# Patient Record
Sex: Female | Born: 1980
Health system: Southern US, Community
[De-identification: ages and names within clinical notes are randomized; demographics above are authoritative.]

## PROBLEM LIST (undated history)

## (undated) DIAGNOSIS — Z789 Other specified health status: Secondary | ICD-10-CM

## (undated) DIAGNOSIS — Z6833 Body mass index (BMI) 33.0-33.9, adult: Secondary | ICD-10-CM

## (undated) DIAGNOSIS — I10 Essential (primary) hypertension: Secondary | ICD-10-CM

## (undated) HISTORY — DX: Body mass index (BMI) 33.0-33.9, adult: Z68.33

## (undated) HISTORY — DX: Essential (primary) hypertension: I10

## (undated) HISTORY — PX: NO PAST SURGERIES: SHX2092

---

## 2003-09-15 ENCOUNTER — Emergency Department (HOSPITAL_COMMUNITY): Admission: EM | Admit: 2003-09-15 | Discharge: 2003-09-16 | Payer: Self-pay | Admitting: Emergency Medicine

## 2008-01-30 ENCOUNTER — Emergency Department (HOSPITAL_COMMUNITY): Admission: EM | Admit: 2008-01-30 | Discharge: 2008-01-30 | Payer: Self-pay | Admitting: Emergency Medicine

## 2010-10-21 ENCOUNTER — Inpatient Hospital Stay (HOSPITAL_COMMUNITY): Admission: AD | Admit: 2010-10-21 | Payer: Self-pay | Admitting: Obstetrics

## 2010-11-16 ENCOUNTER — Inpatient Hospital Stay (HOSPITAL_COMMUNITY)
Admission: AD | Admit: 2010-11-16 | Discharge: 2010-11-16 | Disposition: A | Discharge status: Home / Self Care | Payer: Self-pay | Source: Ambulatory Visit | Attending: Obstetrics | Admitting: Obstetrics

## 2010-11-16 DIAGNOSIS — O99891 Other specified diseases and conditions complicating pregnancy: Secondary | ICD-10-CM | POA: Insufficient documentation

## 2010-11-16 DIAGNOSIS — O9989 Other specified diseases and conditions complicating pregnancy, childbirth and the puerperium: Secondary | ICD-10-CM

## 2010-11-16 DIAGNOSIS — R109 Unspecified abdominal pain: Secondary | ICD-10-CM | POA: Insufficient documentation

## 2010-11-16 DIAGNOSIS — R197 Diarrhea, unspecified: Secondary | ICD-10-CM | POA: Insufficient documentation

## 2010-11-26 ENCOUNTER — Inpatient Hospital Stay (HOSPITAL_COMMUNITY)
Admission: AD | Admit: 2010-11-26 | Discharge: 2010-11-29 | DRG: 774 | Disposition: A | Discharge status: Home / Self Care | Payer: Medicaid Other | Source: Ambulatory Visit | Attending: Obstetrics | Admitting: Obstetrics

## 2010-11-26 LAB — CBC
HCT: 36.2 % (ref 36.0–46.0)
MCHC: 34 g/dL (ref 30.0–36.0)
MCV: 86 fL (ref 78.0–100.0)
Platelets: 436 10*3/uL — ABNORMAL HIGH (ref 150–400)
RDW: 13.5 % (ref 11.5–15.5)

## 2010-11-28 LAB — CBC
HCT: 32 % — ABNORMAL LOW (ref 36.0–46.0)
MCH: 28.6 pg (ref 26.0–34.0)
MCHC: 33.1 g/dL (ref 30.0–36.0)
MCV: 86.5 fL (ref 78.0–100.0)
RDW: 13.6 % (ref 11.5–15.5)

## 2011-05-21 LAB — BASIC METABOLIC PANEL
BUN: 11
Calcium: 9.3
Creatinine, Ser: 0.68
GFR calc non Af Amer: >60
Glucose, Bld: 78

## 2011-05-21 LAB — CBC
HCT: 41
Platelets: 303
RDW: 12.2

## 2011-05-21 LAB — POCT CARDIAC MARKERS
Myoglobin, poc: 46.9
Operator id: 272551

## 2011-05-21 LAB — D-DIMER, QUANTITATIVE: D-Dimer, Quant: <0.22

## 2011-08-10 ENCOUNTER — Emergency Department (HOSPITAL_BASED_OUTPATIENT_CLINIC_OR_DEPARTMENT_OTHER)
Admission: EM | Admit: 2011-08-10 | Discharge: 2011-08-10 | Disposition: A | Discharge status: Home / Self Care | Payer: Self-pay | Attending: Emergency Medicine | Admitting: Emergency Medicine

## 2011-08-10 ENCOUNTER — Emergency Department (INDEPENDENT_AMBULATORY_CARE_PROVIDER_SITE_OTHER): Payer: Self-pay

## 2011-08-10 ENCOUNTER — Other Ambulatory Visit: Payer: Self-pay

## 2011-08-10 ENCOUNTER — Encounter: Payer: Self-pay | Admitting: *Deleted

## 2011-08-10 DIAGNOSIS — M79609 Pain in unspecified limb: Secondary | ICD-10-CM

## 2011-08-10 DIAGNOSIS — M542 Cervicalgia: Secondary | ICD-10-CM | POA: Insufficient documentation

## 2011-08-10 DIAGNOSIS — R0789 Other chest pain: Secondary | ICD-10-CM

## 2011-08-10 DIAGNOSIS — N644 Mastodynia: Secondary | ICD-10-CM | POA: Insufficient documentation

## 2011-08-10 DIAGNOSIS — R0602 Shortness of breath: Secondary | ICD-10-CM | POA: Insufficient documentation

## 2011-08-10 LAB — BASIC METABOLIC PANEL
BUN: 11 mg/dL (ref 6–23)
CO2: 24 mEq/L (ref 19–32)
Calcium: 9.4 mg/dL (ref 8.4–10.5)
Chloride: 106 mEq/L (ref 96–112)
Creatinine, Ser: 0.6 mg/dL (ref 0.50–1.10)
GFR calc Af Amer: >90 mL/min (ref >90)
GFR calc non Af Amer: >90 mL/min (ref >90)
Glucose, Bld: 92 mg/dL (ref 70–99)
Potassium: 3.7 mEq/L (ref 3.5–5.1)
Sodium: 140 mEq/L (ref 135–145)

## 2011-08-10 LAB — CBC
HCT: 41.1 % (ref 36.0–46.0)
Hemoglobin: 14.1 g/dL (ref 12.0–15.0)
MCH: 28.4 pg (ref 26.0–34.0)
MCHC: 34.3 g/dL (ref 30.0–36.0)
MCV: 82.9 fL (ref 78.0–100.0)
Platelets: 336 10*3/uL (ref 150–400)
RBC: 4.96 MIL/uL (ref 3.87–5.11)
RDW: 12.9 % (ref 11.5–15.5)
WBC: 10.7 10*3/uL — ABNORMAL HIGH (ref 4.0–10.5)

## 2011-08-10 LAB — URINALYSIS, ROUTINE W REFLEX MICROSCOPIC
Glucose, UA: NEGATIVE mg/dL
Hgb urine dipstick: NEGATIVE
Ketones, ur: NEGATIVE mg/dL
Protein, ur: NEGATIVE mg/dL
Urobilinogen, UA: 1 mg/dL (ref 0.0–1.0)

## 2011-08-10 LAB — URINE MICROSCOPIC-ADD ON: RBC / HPF: NONE SEEN RBC/hpf (ref <3)

## 2011-08-10 LAB — PREGNANCY, URINE: Preg Test, Ur: NEGATIVE

## 2011-08-10 MED ORDER — ACETAMINOPHEN 500 MG PO TABS
1000.0000 mg | ORAL_TABLET | Freq: Once | ORAL | Status: AC
  Administered 2011-08-10: 1000 mg via ORAL
  Filled 2011-08-10: qty 2

## 2011-08-10 NOTE — ED Provider Notes (Signed)
History  This chart was scribed for Gerhard Munch, MD by Bennett Scrape. This patient was seen in room MH10/MH10 and the patient's care was started at 5:00PM.  CSN: [id number] Arrival date & time: 08/10/2011  4:41 PM   First MD Initiated Contact with Patient 08/10/11 1653      Chief Complaint  Patient presents with  . Chest Pain     The history is provided by the patient. No language interpreter was used.    Ariel Barnes is a 30 y.o. female who presents to the Emergency Department complaining of 2 weeks of gradual onset, gradually worsening, constant pain in her left breast with occasional radiating pain described as pulsating to her left shoulder, left upper back, left arm and left neck. Pt states that she has experienced this pain for 2 months but that it has returned over the last 2 weeks and has become gradually worse. Pt also states that she feels a pulsating pain with deep breaths that is not exertional and occasionally feels pulsations in the right breast as well. She reports that she saw her PCP and was told that it was a muscle pain.  She also reports that IB profen does not improve her symptoms. She states that she experienced a similar but milder pain when nursing. Pt also c/o a mild cough and occasional mild SOB that is not exertional. Pt denies confusion, fever, sore throat, chest pain, abdominal pain, nausea, diarrhea, vomiting, cranial issues and leg swelling as associated symptoms. She denies having a h/o any chronic medical conditions and being on any prescription medications at home. She denies any recent recent travel or injury as the cause of the pain. Pt denies smoking and alcohol use.    History reviewed. No pertinent past medical history.  History reviewed. No pertinent past surgical history.  History reviewed. No pertinent family history.  History  Substance Use Topics  . Smoking status: Never Smoker   . Smokeless tobacco: Not on file  . Alcohol Use:      OB History    Grav Para Term Preterm Abortions TAB SAB Ect Mult Living                  Review of Systems  Constitutional: Negative for fever.  HENT: Positive for neck pain (left sided). Negative for congestion and sore throat.   Respiratory: Positive for cough and shortness of breath (ocassionally).   Cardiovascular: Negative for leg swelling.  Gastrointestinal: Negative for nausea, vomiting, abdominal pain and diarrhea.  Musculoskeletal: Positive for back pain (left sided).       Constant left breast pain  Neurological: Negative for syncope, weakness, light-headedness and headaches.  Psychiatric/Behavioral: Negative for confusion.  All other systems reviewed and are negative.    Allergies  Review of patient's allergies indicates no known allergies.  Home Medications   Current Outpatient Rx  Name Route Sig Dispense Refill  . IBUPROFEN 200 MG PO TABS Oral Take 400 mg by mouth every 6 (six) hours as needed. For pain      . LEVONORGESTREL 20 MCG/24HR IU IUD Intrauterine 1 each by Intrauterine route once.        Triage Vitals: BP 125/84  Pulse 55  Temp(Src) 98.3 F (36.8 C) (Oral)  Resp 16  Ht 5\' 4"  (1.626 m)  Wt 180 lb (81.647 kg)  BMI 30.90 kg/m2  SpO2 100%  Physical Exam  Nursing note and vitals reviewed. Constitutional: She is oriented to person, place, and time. She appears well-developed  and well-nourished.  HENT:  Head: Normocephalic and atraumatic.       No pharyngitis   Eyes: Conjunctivae and EOM are normal.  Neck: Normal range of motion. Neck supple.  Cardiovascular: Normal rate, regular rhythm and normal heart sounds.   Pulmonary/Chest: Effort normal and breath sounds normal. No respiratory distress. She exhibits no tenderness.  Abdominal: Soft. Bowel sounds are normal. There is no tenderness.       No CVA tenderness  Musculoskeletal: Normal range of motion. She exhibits tenderness (fibrocystic left breast changes, minimally tender, no palpable  cyst, no superficial changes). She exhibits no edema.  Neurological: She is alert and oriented to person, place, and time. No cranial nerve deficit.  Skin: Skin is warm and dry.  Psychiatric: She has a normal mood and affect. Her behavior is normal.    ED Course  Procedures (including critical care time)  DIAGNOSTIC STUDIES: Oxygen Saturation is 100% on room air, normal by my interpretation.    COORDINATION OF CARE: 5:10PM-Discussed treatment plan with pt at bedside and pt agreed to plan.   Labs Reviewed  URINALYSIS, ROUTINE W REFLEX MICROSCOPIC - Abnormal; Notable for the following:    Leukocytes, UA TRACE (*)    All other components within normal limits  CBC - Abnormal; Notable for the following:    WBC 10.7 (*)    All other components within normal limits  URINE MICROSCOPIC-ADD ON - Abnormal; Notable for the following:    Squamous Epithelial / LPF FEW (*)    Bacteria, UA FEW (*)    All other components within normal limits  PREGNANCY, URINE  BASIC METABOLIC PANEL   Dg Chest 2 View  08/10/2011  *RADIOLOGY REPORT*  Clinical Data: History of pain in the left side of the chest and down the left arm.  CHEST - 2 VIEW  Comparison: None.  Findings: The cardiac silhouette is normal size and shape.  No mediastinal or hilar abnormalities are evident. No pleural abnormality is evident. The lungs are well aerated and free of infiltrates.  No pneumothorax is evident. Bones appear average for age.  IMPRESSION: No acute or active cardiopulmonary or pleural abnormality is evident.  Original Report Authenticated By: Crawford Givens, M.D.  (REVIEWED BY ME)   No diagnosis found.   Date: 08/10/2011  Rate: 66  Rhythm: normal sinus rhythm and sinus arrhythmia  QRS Axis: normal  Intervals: normal  ST/T Wave abnormalities: normal  Conduction Disutrbances:none  Narrative Interpretation:   Old EKG Reviewed: none available ABNORMAL ECG   MDM  I personally performed the services described in this  documentation, which was scribed in my presence. The recorded information has been reviewed and considered.  This well-appearing 30 year old female now presents with 2 weeks of left sided breast pain the patient has no risk factors for CAD, nor any for PE. The patient also describes a history of similar pain throughout her lactating. This history is suggestive of a fibrotic changes of the breast. The patient's laboratory evaluation is unremarkable. She denies any urologic complaints and thus will not be treated for UTI the patient's ECG and chest x-ray are similarly reassuring. The patient will be advised on analgesics, and be discharged to follow up at [facility] for an ultrasound and continued evaluation  Gerhard Munch, MD 08/10/11 [phone]

## 2011-08-10 NOTE — ED Notes (Signed)
Pt c/o left sided chest pain  X 2 weeks, seen by PMD for same

## 2013-01-29 ENCOUNTER — Emergency Department (HOSPITAL_BASED_OUTPATIENT_CLINIC_OR_DEPARTMENT_OTHER)
Admission: EM | Admit: 2013-01-29 | Discharge: 2013-01-29 | Disposition: A | Discharge status: Home / Self Care | Payer: No Typology Code available for payment source | Attending: Emergency Medicine | Admitting: Emergency Medicine

## 2013-01-29 ENCOUNTER — Encounter (HOSPITAL_BASED_OUTPATIENT_CLINIC_OR_DEPARTMENT_OTHER): Payer: Self-pay

## 2013-01-29 DIAGNOSIS — Y9389 Activity, other specified: Secondary | ICD-10-CM | POA: Insufficient documentation

## 2013-01-29 DIAGNOSIS — IMO0002 Reserved for concepts with insufficient information to code with codable children: Secondary | ICD-10-CM | POA: Insufficient documentation

## 2013-01-29 DIAGNOSIS — S46909A Unspecified injury of unspecified muscle, fascia and tendon at shoulder and upper arm level, unspecified arm, initial encounter: Secondary | ICD-10-CM | POA: Insufficient documentation

## 2013-01-29 DIAGNOSIS — Y92410 Unspecified street and highway as the place of occurrence of the external cause: Secondary | ICD-10-CM | POA: Insufficient documentation

## 2013-01-29 DIAGNOSIS — S4980XA Other specified injuries of shoulder and upper arm, unspecified arm, initial encounter: Secondary | ICD-10-CM | POA: Insufficient documentation

## 2013-01-29 DIAGNOSIS — S139XXA Sprain of joints and ligaments of unspecified parts of neck, initial encounter: Secondary | ICD-10-CM | POA: Insufficient documentation

## 2013-01-29 DIAGNOSIS — S161XXA Strain of muscle, fascia and tendon at neck level, initial encounter: Secondary | ICD-10-CM

## 2013-01-29 MED ORDER — CYCLOBENZAPRINE HCL 10 MG PO TABS: 10.0000 mg | ORAL_TABLET | Freq: Two times a day (BID) | ORAL | Status: DC | PRN

## 2013-01-29 MED ORDER — TRAMADOL HCL 50 MG PO TABS: 50.0000 mg | ORAL_TABLET | Freq: Four times a day (QID) | ORAL | Status: DC | PRN
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
## 2013-11-30 ENCOUNTER — Other Ambulatory Visit: Payer: Self-pay | Admitting: Nurse Practitioner

## 2013-11-30 DIAGNOSIS — N644 Mastodynia: Secondary | ICD-10-CM

## 2013-12-11 ENCOUNTER — Ambulatory Visit
Admission: RE | Admit: 2013-12-11 | Discharge: 2013-12-11 | Disposition: A | Discharge status: Home / Self Care | Payer: No Typology Code available for payment source | Source: Ambulatory Visit | Attending: Nurse Practitioner | Admitting: Nurse Practitioner

## 2013-12-11 DIAGNOSIS — N644 Mastodynia: Secondary | ICD-10-CM

## 2014-03-13 LAB — CYTOLOGY - PAP: PAP SMEAR: NEGATIVE

## 2014-08-24 NOTE — L&D Delivery Note (Signed)
Delivery Note At 3:32 PM a viable female was delivered via Vaginal, Spontaneous Delivery (Presentation: Right Occiput Anterior).  APGAR: , ; weight  .   Placenta status: , .  Cord:  with the following complications: .   :  Anesthesia:   Episiotomy:   Lacerations:   Suture Repair: 2.0 Est. Blood Loss (mL):    Mom to postpartum.  Baby to Couplet care / Skin to Skin.  [name] A 10/01/2014, 3:42 PM

## 2014-09-21 ENCOUNTER — Encounter (HOSPITAL_COMMUNITY): Payer: Self-pay | Admitting: *Deleted

## 2014-09-21 ENCOUNTER — Inpatient Hospital Stay (HOSPITAL_COMMUNITY)
Admission: AD | Admit: 2014-09-21 | Discharge: 2014-09-21 | Disposition: A | Discharge status: Home / Self Care | Payer: No Typology Code available for payment source | Source: Ambulatory Visit | Attending: Obstetrics | Admitting: Obstetrics

## 2014-09-21 DIAGNOSIS — O368130 Decreased fetal movements, third trimester, not applicable or unspecified: Secondary | ICD-10-CM | POA: Insufficient documentation

## 2014-09-21 DIAGNOSIS — Z3A37 37 weeks gestation of pregnancy: Secondary | ICD-10-CM | POA: Insufficient documentation

## 2014-09-21 DIAGNOSIS — Z3490 Encounter for supervision of normal pregnancy, unspecified, unspecified trimester: Secondary | ICD-10-CM

## 2014-09-21 HISTORY — DX: Other specified health status: Z78.9

## 2014-09-21 NOTE — MAU Note (Signed)
Decreased fetal movement today,  Mucous d/c yesterday, no bleeding.  Contracting yesterday 5-10, stopped last night.  Only a few contractions today.  Mainly worried about movement

## 2014-09-21 NOTE — Discharge Instructions (Signed)
Reasons to return to MAU:  1.  Contractions are  5 minutes apart or less, each last 1 minute, these have been going on for 1-2 hours, and you cannot walk or talk during them 2.  You have a large gush of fluid, or a trickle of fluid that will not stop and you have to wear a pad 3.  You have bleeding that is bright red, heavier than spotting--like menstrual bleeding (spotting can be normal in early labor or after a check of your cervix) 4.  You do not feel the baby moving like he/she normally does.  Evaluacin de los movimientos fetales  (Fetal Movement Counts) Nombre del paciente: __________________________________________________ Ariel Barnes estimada: ____________________ Ariel Barnes de los movimientos fetales es muy recomendable en los embarazos de alto riesgo, pero tambin es una buena idea que lo hagan todas las Ariel Barnes. El Ariel Barnes, retail que comience a contarlos a las 28 semanas de [place]. Los movimientos fetales suelen aumentar:   Despus de Music therapist.  Despus de la actividad fsica.  Despus de comer o beber Ariel Barnes o fro.  En reposo. Preste atencin cuando sienta que el beb est ms activo. Esto le ayudar a notar un patrn de ciclos de vigilia y sueo de su beb y cules son los factores que contribuyen a un aumento de los movimientos fetales. Es importante llevar a cabo un recuento de movimientos fetales, al mismo tiempo cada da, cuando el beb normalmente est ms activo.  [place] 1. Busque un lugar tranquilo y cmodo para sentarse o recostarse sobre el lado izquierdo. Al recostarse sobre su lado izquierdo, le proporciona una mejor circulacin de [place] y oxgeno al beb. 2. Anote el da y la hora en una hoja de papel o en un diario. 3. Comience contando las pataditas, revoloteos, chasquidos, vueltas o pinchazos en un perodo de 2 horas. Debe sentir al menos 10 movimientos en 2 horas. 4. Si no siente 10 movimientos en 2  horas, espere 2  3 horas y cuente de nuevo. Busque cambios en el patrn o si no cuenta lo suficiente en 2 horas. SOLICITE ATENCIN MDICA SI:   Siente menos de 10 pataditas en 2 horas, en dos intentos.  No hay movimientos durante una hora.  El patrn se modifica o le lleva ms [place] contar las 10 pataditas.  Siente que el beb no se mueve como lo hace habitualmente. Fecha: ____________ Movimientos: ____________ Ariel Barnes inicio: ____________ Ariel Barnes finalizacin: ____________  Ariel Barnes: ____________ Movimientos: ____________ Ariel Barnes inicio: ____________ Ariel Barnes finalizacin: ____________  Ariel Barnes: ____________ Movimientos: ____________ Ariel Barnes inicio: ____________ Ariel Barnes finalizacin: ____________  Ariel Barnes: ____________ Movimientos: ____________ Ariel Barnes inicio: ____________ Ariel Barnes finalizacin: ____________  Ariel Barnes: ____________ Movimientos: ____________ Ariel Barnes inicio: ____________ Ariel Barnes de finalizacin: ____________  Ariel Barnes: ____________ Movimientos: ____________ Ariel Barnes de inicio: ____________ Ariel Barnes de finalizacin: ____________  Ariel Barnes: ____________ Movimientos: ____________ Ariel Barnes de inicio: ____________ Ariel Barnes de finalizacin: ____________  Ariel Barnes: ____________ Movimientos: ____________ Ariel Barnes de inicio: ____________ Ariel Barnes de finalizacin: ____________  Ariel Barnes: ____________ Movimientos: ____________ Ariel Barnes de inicio: ____________ Ariel Barnes de finalizacin: ____________  Ariel Barnes: ____________ Movimientos: ____________ Ariel Barnes de inicio: ____________ Ariel Barnes de finalizacin: ____________  Ariel Barnes: ____________ Movimientos: ____________ Ariel Barnes inicio: ____________ Ariel Barnes de finalizacin: ____________  Ariel Barnes: ____________ Movimientos: ____________ Ariel Barnes inicio: ____________ Ariel Barnes finalizacin: ____________  Ariel Barnes: ____________ Movimientos: ____________ Ariel Barnes inicio: ____________ Ariel Barnes finalizacin: ____________  Ariel Barnes: ____________ Movimientos: ____________ Ariel Barnes inicio: ____________ Ariel Barnes finalizacin:  ____________  Ariel Barnes: ____________ Movimientos: ____________ Ariel Barnes inicio: ____________ Ariel Barnes finalizacin: ____________  Ariel Barnes: ____________ Movimientos: ____________ Ariel Barnes inicio: ____________ Ariel Barnes finalizacin: ____________  Ariel Barnes: ____________ Movimientos: ____________ Ariel Barnes inicio: ____________ Ariel Barnes finalizacin: ____________  Ariel Barnes: ____________ Movimientos: ____________ Ariel Barnes inicio: ____________ Ariel Barnes finalizacin: ____________  Ariel Barnes: ____________ Movimientos: ____________ Ariel Barnes inicio: ____________ Ariel Barnes finalizacin: ____________  Ariel Barnes: ____________ Movimientos: ____________ Ariel Barnes inicio: ____________ Ariel Barnes de finalizacin: ____________  Ariel Barnes: ____________ Movimientos: ____________ Ariel Barnes de inicio: ____________ Ariel Barnes de finalizacin: ____________  Ariel Barnes: ____________ Movimientos: ____________ Ariel Barnes de inicio: ____________ Ariel Barnes de finalizacin: ____________  Ariel Barnes: ____________ Movimientos: ____________ Ariel Barnes de inicio: ____________ Ariel Barnes de finalizacin: ____________  Ariel Barnes: ____________ Movimientos: ____________ Ariel Barnes de inicio: ____________ Ariel Barnes de finalizacin: ____________  Ariel Barnes: ____________ Movimientos: ____________ Ariel Barnes de inicio: ____________ Ariel Barnes de finalizacin: ____________  Ariel Barnes: ____________ Movimientos: ____________ Ariel Barnes de inicio: ____________ Ariel Barnes de finalizacin: ____________  Ariel Barnes: ____________ Movimientos: ____________ Ariel Barnes de inicio: ____________ Ariel Barnes de finalizacin: ____________  Ariel Barnes: ____________ Movimientos: ____________ Ariel Barnes de inicio: ____________ Ariel Barnes de finalizacin: ____________  Ariel Barnes: ____________ Movimientos: ____________ Ariel Barnes de inicio: ____________ Ariel Barnes de finalizacin: ____________  Ariel Barnes: ____________ Movimientos: ____________ Ariel Barnes de inicio: ____________ Ariel Barnes de finalizacin: ____________  Ariel Barnes: ____________ Movimientos: ____________ Ariel Barnes de inicio: ____________ Ariel Barnes de finalizacin: ____________  Ariel Barnes: ____________  Movimientos: ____________ Ariel Barnes de inicio: ____________ Ariel Barnes de finalizacin: ____________  Ariel Barnes: ____________ Movimientos: ____________ Ariel Barnes de inicio: ____________ Ariel Barnes de finalizacin: ____________  Ariel Barnes: ____________ Movimientos: ____________ Ariel Barnes de inicio: ____________ Ariel Barnes de finalizacin: ____________  Ariel Barnes: ____________ Movimientos: ____________ Ariel Barnes de inicio: ____________ Ariel Barnes de finalizacin: ____________  Ariel Barnes: ____________ Movimientos: ____________ Ariel Barnes de inicio: ____________ Ariel Barnes de finalizacin: ____________  Ariel Barnes: ____________ Movimientos: ____________ Ariel Barnes de inicio: ____________ Ariel Barnes de finalizacin: ____________  Ariel Barnes: ____________ Movimientos: ____________ Ariel Barnes de inicio: ____________ Ariel Barnes de finalizacin: ____________  Ariel Barnes: ____________ Movimientos: ____________ Ariel Barnes de inicio: ____________ Ariel Barnes de finalizacin: ____________  Ariel Barnes: ____________ Movimientos: ____________ Ariel Barnes de inicio: ____________ Ariel Barnes de finalizacin: ____________  Ariel Barnes: ____________ Movimientos: ____________ Ariel Barnes de inicio: ____________ Ariel Barnes de finalizacin: ____________  Ariel Barnes: ____________ Movimientos: ____________ Ariel Barnes de inicio: ____________ Ariel Barnes de finalizacin: ____________  Ariel Barnes: ____________ Movimientos: ____________ Ariel Barnes de inicio: ____________ Ariel Barnes de finalizacin: ____________  Ariel Barnes: ____________ Movimientos: ____________ Ariel Barnes de inicio: ____________ Ariel Barnes de finalizacin: ____________  Ariel Barnes: ____________ Movimientos: ____________ Ariel Barnes de inicio: ____________ Ariel Barnes de finalizacin: ____________  Ariel Barnes: ____________ Movimientos: ____________ Ariel Barnes de inicio: ____________ Ariel Barnes de finalizacin: ____________  Ariel Barnes: ____________ Movimientos: ____________ Ariel Barnes de inicio: ____________ Ariel Barnes de finalizacin: ____________  Ariel Barnes: ____________ Movimientos: ____________ Ariel Barnes de inicio: ____________ Ariel Barnes de finalizacin: ____________  Ariel Barnes: ____________ Movimientos: ____________ Ariel Barnes de inicio:  ____________ Ariel Barnes de finalizacin: ____________  Ariel Barnes: ____________ Movimientos: ____________ Ariel Barnes de inicio: ____________ Ariel Barnes de finalizacin: ____________  Ariel Barnes: ____________ Movimientos: ____________ Ariel Barnes de inicio: ____________ Ariel Barnes de finalizacin: ____________  Ariel Barnes: ____________ Movimientos: ____________ Ariel Barnes de inicio: ____________ Ariel Barnes de finalizacin: ____________  Ariel Barnes: ____________ Movimientos: ____________ Ariel Barnes de inicio: ____________ Ariel Barnes de finalizacin: ____________  Ariel Barnes: ____________ Movimientos: ____________ Ariel Barnes de inicio: ____________ Ariel Barnes de finalizacin: ____________  Ariel Barnes: ____________ Movimientos: ____________ Ariel Barnes de inicio: ____________ Ariel Barnes de finalizacin: ____________  Ariel Barnes: ____________ Movimientos: ____________ Ariel Barnes de inicio: ____________ Ariel Barnes de finalizacin: ____________  Document Released: 11/17/2007 Document Revised: 07/27/2012 Exit[employer] Patient Information 2015 [place], [place]. This information is not intended to replace advice given to you by your health care provider. Make sure you discuss any questions you have with your health care provider.

## 2014-10-01 ENCOUNTER — Inpatient Hospital Stay (HOSPITAL_COMMUNITY): Payer: Medicaid Other | Admitting: Anesthesiology

## 2014-10-01 ENCOUNTER — Encounter (HOSPITAL_COMMUNITY): Payer: Self-pay | Admitting: *Deleted

## 2014-10-01 ENCOUNTER — Inpatient Hospital Stay (HOSPITAL_COMMUNITY)
Admission: AD | Admit: 2014-10-01 | Discharge: 2014-10-02 | DRG: 775 | Disposition: A | Discharge status: Home / Self Care | Payer: Medicaid Other | Source: Ambulatory Visit | Attending: Obstetrics | Admitting: Obstetrics

## 2014-10-01 DIAGNOSIS — IMO0001 Reserved for inherently not codable concepts without codable children: Secondary | ICD-10-CM

## 2014-10-01 DIAGNOSIS — Z3A39 39 weeks gestation of pregnancy: Secondary | ICD-10-CM | POA: Diagnosis present

## 2014-10-01 DIAGNOSIS — Z23 Encounter for immunization: Secondary | ICD-10-CM | POA: Diagnosis not present

## 2014-10-01 LAB — OB RESULTS CONSOLE HEPATITIS B SURFACE ANTIGEN: HEP B S AG: NEGATIVE

## 2014-10-01 LAB — CBC
HEMATOCRIT: 36.6 % (ref 36.0–46.0)
Hemoglobin: 12.4 g/dL (ref 12.0–15.0)
MCH: 28.8 pg (ref 26.0–34.0)
MCHC: 33.9 g/dL (ref 30.0–36.0)
MCV: 84.9 fL (ref 78.0–100.0)
PLATELETS: 411 10*3/uL — AB (ref 150–400)
RBC: 4.31 MIL/uL (ref 3.87–5.11)
RDW: 13.4 % (ref 11.5–15.5)
WBC: 19.8 10*3/uL — AB (ref 4.0–10.5)

## 2014-10-01 LAB — OB RESULTS CONSOLE ABO/RH: RH Type: POSITIVE

## 2014-10-01 LAB — OB RESULTS CONSOLE ANTIBODY SCREEN: ANTIBODY SCREEN: NEGATIVE

## 2014-10-01 LAB — POCT FERN TEST: POCT Fern Test: POSITIVE

## 2014-10-01 LAB — OB RESULTS CONSOLE GC/CHLAMYDIA
Chlamydia: NEGATIVE
Gonorrhea: NEGATIVE

## 2014-10-01 LAB — OB RESULTS CONSOLE GBS: STREP GROUP B AG: NEGATIVE

## 2014-10-01 LAB — OB RESULTS CONSOLE RUBELLA ANTIBODY, IGM: RUBELLA: IMMUNE

## 2014-10-01 LAB — OB RESULTS CONSOLE RPR: RPR: NONREACTIVE

## 2014-10-01 LAB — OB RESULTS CONSOLE HIV ANTIBODY (ROUTINE TESTING): HIV: NONREACTIVE

## 2014-10-01 MED ORDER — DIPHENHYDRAMINE HCL 25 MG PO CAPS: 25.0000 mg | ORAL_CAPSULE | Freq: Four times a day (QID) | ORAL | Status: DC | PRN

## 2014-10-01 MED ORDER — LIDOCAINE HCL (PF) 1 % IJ SOLN
INTRAMUSCULAR | Status: DC | PRN
  Administered 2014-10-01 (×2): 4 mL

## 2014-10-01 MED ORDER — BENZOCAINE-MENTHOL 20-0.5 % EX AERO
1.0000 "application " | INHALATION_SPRAY | CUTANEOUS | Status: DC | PRN
  Administered 2014-10-01 – 2014-10-02 (×2): 1 via TOPICAL
  Filled 2014-10-01 (×2): qty 56

## 2014-10-01 MED ORDER — OXYCODONE-ACETAMINOPHEN 5-325 MG PO TABS: 2.0000 | ORAL_TABLET | ORAL | Status: DC | PRN

## 2014-10-01 MED ORDER — SIMETHICONE 80 MG PO CHEW: 80.0000 mg | CHEWABLE_TABLET | ORAL | Status: DC | PRN

## 2014-10-01 MED ORDER — ACETAMINOPHEN 325 MG PO TABS: 650.0000 mg | ORAL_TABLET | ORAL | Status: DC | PRN

## 2014-10-01 MED ORDER — LACTATED RINGERS IV SOLN
500.0000 mL | INTRAVENOUS | Status: DC | PRN
  Administered 2014-10-01: 1000 mL via INTRAVENOUS

## 2014-10-01 MED ORDER — ZOLPIDEM TARTRATE 5 MG PO TABS: 5.0000 mg | ORAL_TABLET | Freq: Every evening | ORAL | Status: DC | PRN

## 2014-10-01 MED ORDER — ONDANSETRON HCL 4 MG/2ML IJ SOLN: 4.0000 mg | Freq: Four times a day (QID) | INTRAMUSCULAR | Status: DC | PRN

## 2014-10-01 MED ORDER — SENNOSIDES-DOCUSATE SODIUM 8.6-50 MG PO TABS
2.0000 | ORAL_TABLET | ORAL | Status: DC
  Administered 2014-10-01: 2 via ORAL
  Filled 2014-10-01: qty 2

## 2014-10-01 MED ORDER — CITRIC ACID-SODIUM CITRATE 334-500 MG/5ML PO SOLN
30.0000 mL | ORAL | Status: DC | PRN
Start: 2014-10-01 — End: 2014-10-01

## 2014-10-01 MED ORDER — LIDOCAINE HCL (PF) 1 % IJ SOLN
30.0000 mL | INTRAMUSCULAR | Status: DC | PRN
  Filled 2014-10-01: qty 30

## 2014-10-01 MED ORDER — WITCH HAZEL-GLYCERIN EX PADS: 1.0000 "application " | MEDICATED_PAD | CUTANEOUS | Status: DC | PRN

## 2014-10-01 MED ORDER — OXYCODONE-ACETAMINOPHEN 5-325 MG PO TABS: 1.0000 | ORAL_TABLET | ORAL | Status: DC | PRN

## 2014-10-01 MED ORDER — FERROUS SULFATE 325 (65 FE) MG PO TABS
325.0000 mg | ORAL_TABLET | Freq: Two times a day (BID) | ORAL | Status: DC
  Administered 2014-10-02: 325 mg via ORAL
  Filled 2014-10-01: qty 1

## 2014-10-01 MED ORDER — IBUPROFEN 600 MG PO TABS
600.0000 mg | ORAL_TABLET | Freq: Four times a day (QID) | ORAL | Status: DC
  Administered 2014-10-01 – 2014-10-02 (×5): 600 mg via ORAL
  Filled 2014-10-01 (×5): qty 1

## 2014-10-01 MED ORDER — PRENATAL MULTIVITAMIN CH
1.0000 | ORAL_TABLET | Freq: Every day | ORAL | Status: DC
  Administered 2014-10-02: 1 via ORAL
  Filled 2014-10-01: qty 1

## 2014-10-01 MED ORDER — OXYTOCIN 40 UNITS IN LACTATED RINGERS INFUSION - SIMPLE MED
62.5000 mL/h | INTRAVENOUS | Status: DC
  Filled 2014-10-01: qty 1000

## 2014-10-01 MED ORDER — ONDANSETRON HCL 4 MG PO TABS: 4.0000 mg | ORAL_TABLET | ORAL | Status: DC | PRN

## 2014-10-01 MED ORDER — TETANUS-DIPHTH-ACELL PERTUSSIS 5-2.5-18.5 LF-MCG/0.5 IM SUSP
0.5000 mL | Freq: Once | INTRAMUSCULAR | Status: AC
  Administered 2014-10-02: 0.5 mL via INTRAMUSCULAR
  Filled 2014-10-01: qty 0.5

## 2014-10-01 MED ORDER — OXYCODONE-ACETAMINOPHEN 5-325 MG PO TABS
1.0000 | ORAL_TABLET | ORAL | Status: DC | PRN
  Administered 2014-10-02 (×2): 1 via ORAL
  Filled 2014-10-01 (×2): qty 1

## 2014-10-01 MED ORDER — FENTANYL 2.5 MCG/ML BUPIVACAINE 1/10 % EPIDURAL INFUSION (WH - ANES)
INTRAMUSCULAR | Status: DC | PRN
  Administered 2014-10-01: 14 mL/h via EPIDURAL

## 2014-10-01 MED ORDER — OXYTOCIN BOLUS FROM INFUSION
500.0000 mL | INTRAVENOUS | Status: DC
  Administered 2014-10-01: 500 mL via INTRAVENOUS

## 2014-10-01 MED ORDER — ONDANSETRON HCL 4 MG/2ML IJ SOLN: 4.0000 mg | INTRAMUSCULAR | Status: DC | PRN

## 2014-10-01 MED ORDER — LACTATED RINGERS IV SOLN
INTRAVENOUS | Status: DC
  Administered 2014-10-01 (×2): via INTRAVENOUS

## 2014-10-01 MED ORDER — LANOLIN HYDROUS EX OINT: TOPICAL_OINTMENT | CUTANEOUS | Status: DC | PRN

## 2014-10-01 MED ORDER — FENTANYL 2.5 MCG/ML BUPIVACAINE 1/10 % EPIDURAL INFUSION (WH - ANES)
INTRAMUSCULAR | Status: AC
  Administered 2014-10-01: 11:00:00
  Filled 2014-10-01: qty 125

## 2014-10-01 MED ORDER — PHENYLEPHRINE 40 MCG/ML (10ML) SYRINGE FOR IV PUSH (FOR BLOOD PRESSURE SUPPORT)
PREFILLED_SYRINGE | INTRAVENOUS | Status: AC
  Filled 2014-10-01: qty 20

## 2014-10-01 MED ORDER — DIBUCAINE 1 % RE OINT: 1.0000 "application " | TOPICAL_OINTMENT | RECTAL | Status: DC | PRN

## 2014-10-01 MED ORDER — BUTORPHANOL TARTRATE 1 MG/ML IJ SOLN
1.0000 mg | INTRAMUSCULAR | Status: DC | PRN
  Administered 2014-10-01: 1 mg via INTRAVENOUS
  Filled 2014-10-01: qty 1

## 2014-10-01 NOTE — Anesthesia Preprocedure Evaluation (Signed)
Anesthesia Evaluation  Patient identified by MRN, date of birth, ID band Patient awake    Reviewed: Allergy & Precautions, Patient's Chart, lab work & pertinent test results  Airway Mallampati: III  TM Distance: >3 FB Neck ROM: Full    Dental no notable dental hx. (+) Teeth Intact   Pulmonary neg pulmonary ROS,  breath sounds clear to auscultation  Pulmonary exam normal       Cardiovascular negative cardio ROS  Rhythm:Regular Rate:Normal     Neuro/Psych negative neurological ROS  negative psych ROS   GI/Hepatic negative GI ROS, Neg liver ROS,   Endo/Other  Obesity  Renal/GU negative Renal ROS  negative genitourinary   Musculoskeletal negative musculoskeletal ROS (+)   Abdominal (+) + obese,   Peds  Hematology  (+) anemia ,   Anesthesia Other Findings   Reproductive/Obstetrics                             Anesthesia Physical Anesthesia Plan  ASA: II  Anesthesia Plan: General   Post-op Pain Management:    Induction: Intravenous  Airway Management Planned: Natural Airway  Additional Equipment:   Intra-op Plan:   Post-operative Plan:   Informed Consent: I have reviewed the patients History and Physical, chart, labs and discussed the procedure including the risks, benefits and alternatives for the proposed anesthesia with the patient or authorized representative who has indicated his/her understanding and acceptance.     Plan Discussed with: Anesthesiologist  Anesthesia Plan Comments:         Anesthesia Quick Evaluation

## 2014-10-01 NOTE — H&P (Signed)
This is Dr. Gracy Racer dictating the history and physical on  Ariel Barnes  she's a 34 year old gravida 2 para 101 at 58 weeks EDC 10/08/2014 negative GBS membranes ruptured spontaneously 9 PM last night fluid was clear she is in active labor and she is now 8 cm 100% and vertex -1 station Past medical history negative Past surgical history negative Social history negative System review negative Physical exam well-developed female in labor HEENT negative Lungs clear to P&A Heart regular rhythm no murmurs no gallops Breasts negative Abdomen term Pelvic as described above Extremities negative

## 2014-10-01 NOTE — Anesthesia Procedure Notes (Signed)
Epidural Patient location during procedure: OB Start time: 10/01/2014 10:37 AM  Staffing Anesthesiologist: Rayley Gao A. Performed by: anesthesiologist   Preanesthetic Checklist Completed: patient identified, site marked, surgical consent, pre-op evaluation, timeout performed, IV checked, risks and benefits discussed and monitors and equipment checked  Epidural Patient position: sitting Prep: site prepped and draped and DuraPrep Patient monitoring: continuous pulse ox and blood pressure Approach: midline Location: L3-L4 Injection technique: LOR air  Needle:  Needle type: Tuohy  Needle gauge: 17 G Needle length: 9 cm and 9 Needle insertion depth: 6 cm Catheter type: closed end flexible Catheter size: 19 Gauge Catheter at skin depth: 11 cm Test dose: negative and Other  Assessment Events: blood not aspirated, injection not painful, no injection resistance, negative IV test and no paresthesia  Additional Notes Patient identified. Risks and benefits discussed including failed block, incomplete  Pain control, post dural puncture headache, nerve damage, paralysis, blood pressure Changes, nausea, vomiting, reactions to medications-both toxic and allergic and post Partum back pain. All questions were answered. Patient expressed understanding and wished to proceed. Sterile technique was used throughout procedure. Epidural site was Dressed with sterile barrier dressing. No paresthesias, signs of intravascular injection Or signs of intrathecal spread were encountered.  Patient was more comfortable after the epidural was dosed. Please see RN's note for documentation of vital signs and FHR which are stable.

## 2014-10-01 NOTE — MAU Note (Signed)
Leaking fluid since 2100, contractions regular and strong since 0600.

## 2014-10-02 LAB — RPR: RPR Ser Ql: NONREACTIVE

## 2014-10-02 LAB — CBC
HEMATOCRIT: 31.6 % — AB (ref 36.0–46.0)
HEMOGLOBIN: 10.5 g/dL — AB (ref 12.0–15.0)
MCH: 28.4 pg (ref 26.0–34.0)
MCHC: 33.2 g/dL (ref 30.0–36.0)
MCV: 85.4 fL (ref 78.0–100.0)
Platelets: 385 10*3/uL (ref 150–400)
RBC: 3.7 MIL/uL — ABNORMAL LOW (ref 3.87–5.11)
RDW: 13.7 % (ref 11.5–15.5)
WBC: 16.1 10*3/uL — ABNORMAL HIGH (ref 4.0–10.5)

## 2014-10-02 NOTE — Progress Notes (Signed)
I assisted Ariel Barnes with discharges instructions to go home for mom and baby, by Juliann Mule Spanish Interpreter

## 2014-10-02 NOTE — Discharge Summary (Signed)
Obstetric Discharge Summary Reason for Admission: onset of labor Prenatal Procedures: none Intrapartum Procedures: spontaneous vaginal delivery Postpartum Procedures: none Complications-Operative and Postpartum: none HEMOGLOBIN  Date Value Ref Range Status  10/02/2014 10.5* 12.0 - 15.0 g/dL Final   HCT  Date Value Ref Range Status  10/02/2014 31.6* 36.0 - 46.0 % Final    Physical Exam:  General: alert Lochia: appropriate Uterine Fundus: firm Incision: healing well DVT Evaluation: No evidence of DVT seen on physical exam.  Discharge Diagnoses: Term Pregnancy-delivered  Discharge Information: Date: 10/02/2014 Activity: pelvic rest Diet: routine Medications: Percocet Condition: stable Instructions: refer to practice specific booklet Discharge to: home Follow-up Information    Follow up with Frederico Hamman, MD.   Specialty:  Obstetrics and Gynecology   Contact information:   [address] [place] [address] [phone]       Newborn Data: Live born female  Birth Weight: 7 lb 3.9 oz (3285 g) APGAR: 9, 10  Home with mother.  MARSHALL,BERNARD A 10/02/2014, 6:50 AM

## 2014-10-02 NOTE — Anesthesia Postprocedure Evaluation (Signed)
  Anesthesia Post-op Note  Patient: Ariel Barnes  Procedure(s) Performed: * No procedures listed *  Patient Location: Mother/Baby  Anesthesia Type:Epidural  Level of Consciousness: awake, alert , oriented and patient cooperative  Airway and Oxygen Therapy: Patient Spontanous Breathing  Post-op Pain: none  Post-op Assessment: Post-op Vital signs reviewed, Patient's Cardiovascular Status Stable, Respiratory Function Stable, Patent Airway, No headache, No backache, No residual numbness and No residual motor weakness  Post-op Vital Signs: Reviewed and stable  Last Vitals:  Filed Vitals:   10/01/14 2259  BP: 113/59  Pulse: 79  Temp: 37.1 C  Resp: 18    Complications: No apparent anesthesia complications

## 2014-10-02 NOTE — Discharge Instructions (Signed)
Discharge instructions   You can wash your hair  Shower  Eat what you want  Drink what you want  See me in 6 weeks  Your ankles are going to swell more in the next 2 weeks than when pregnant  No sex for 6 weeks   Loras Grieshop A, MD 10/02/2014

## 2014-10-02 NOTE — Progress Notes (Signed)
Patient ID: Ariel Barnes, female   DOB: Apr 30, 1981, 34 y.o.   MRN: [id number] Postpartum day one Vital signs normal Fundus firm Lochia moderate Wants early discharge home today

## 2014-10-02 NOTE — Lactation Note (Addendum)
This note was copied from the chart of Ariel Barnes. Lactation Consultation Note  Patient Name: Ariel Barnes Today's Date: 10/02/2014  this [place] was asked by [facility] RN to see this mother again since they were an early D/C this evening/ Previous LC consult  a #20 NS was started and per mom has a curved tip syringe to instill EBM or formula  Into the top for an appetizer. Per mom and [facility] RN , mom recently fed the baby and was able to get 15 ml down  the baby also with a good latch. Per mom felt comfortable with latch. Discussed with mom and dad due to using a nipple shield and the NS being a barrier , not skin to skin , LC suggested  [facility] Loaner ( per mom active with [place] ). Both receptive obtaining a DEBP WIC loaner from [facility] and  Also extra pumping , and LC O/P apt on Friday 2/12 at 4pm ( per mom and dad no only time they could come for apt.)  LC reviewed sore nipple and engorgement prevention and tx. Imp[ortance of keeping feeding diary for the next 14 days and bring to  F/U Pedis apt and LC O/P apt.  Mother informed of post-discharge support and given phone number to the lactation department, including services for phone  call assistance; out-patient appointments; and breastfeeding support group. List of other breastfeeding resources in the community  given in the handout. Encouraged mother to call for problems or concerns related to breastfeeding. LC F/U apt Friday Feb, 12th at 4pm , apt reminder and instructions given to mom and dad .  LC instructed mo and how to use DEBP , and the parts needed.  Both receptive to information.      Maternal Data    Feeding Feeding Type: Breast Milk with Formula added Length of feed: 15 min  LATCH Score/Interventions Latch: Grasps breast easily, tongue down, lips flanged, rhythmical sucking. Intervention(s): Adjust position;Assist with latch;Breast massage;Breast compression  Audible Swallowing: A few with  stimulation Intervention(s): Skin to skin;Hand expression Intervention(s): Alternate breast massage  Type of Nipple: Inverted Intervention(s): Shells;Reverse pressure;Hand pump (shield)  Comfort (Breast/Nipple): Soft / non-tender  Interventions (Filling): Hand pump;Reverse pressure;Massage Interventions (Mild/moderate discomfort): Comfort gels;Post-pump;Hand expression;Hand massage;Reverse pressue;Breast shields  Hold (Positioning): Assistance needed to correctly position infant at breast and maintain latch. Intervention(s): Breastfeeding basics reviewed;Support Pillows;Position options;Skin to skin  LATCH Score: 6  Lactation Tools Discussed/Used Tools: Shells;Comfort gels;Pump;Nipple Shields Nipple shield size: 20 Shell Type: Inverted Breast pump type: Manual   Consult Status      Myer Haff 10/02/2014, 5:36 PM

## 2014-10-02 NOTE — Lactation Note (Signed)
This note was copied from the chart of Ariel Ponce. Lactation Consultation Note RN reported baby wasn't BF well. Mom has short shaft/semi flat nipples. Appears red and raw looking in center of nipple. Comfort gels given by RN. Explained to mom by demonstration baby isn't obtaining deep latch and that is why her nipples are sore. Fitted #20 NS latched baby well. Hand expression taught w/no colostrum noted. Mom stated she BF her first baby 1 month but had difficulty. Gave shells to wear to assist nipples in everting. Mom stated she didn't want to give formula but doesn't feel like she has anything to give baby. Has hand pump. Encouraged to use as stimulation and assist in everting nipple.  Mom denied need of interpreter. Spoke good english and FOB spoke good english as well. Mom encouraged to feed baby 8-12 times/24 hours and with feeding cues. Educated about newborn behavior. Mom encouraged to waken baby for feeds. Mom reports + breast changes w/pregnancy. Reviewed Baby & Me book's Breastfeeding Basics. Mom placed in shells and encouraged to wear them between feedings. Mom encouraged to do skin-to-skin.Mom encouraged to waken baby for feeds. [place] brochure given w/resources, support groups and [place] services. Patient Name: Ariel Barnes Today's Date: 10/02/2014 Reason for consult: Initial assessment   Maternal Data Has patient been taught Hand Expression?: Yes Does the patient have breastfeeding experience prior to this delivery?: Yes  Feeding Feeding Type: Formula Length of feed: 15 min  LATCH Score/Interventions Latch: Repeated attempts needed to sustain latch, nipple held in mouth throughout feeding, stimulation needed to elicit sucking reflex. Intervention(s): Adjust position;Assist with latch;Breast massage;Breast compression  Audible Swallowing: None Intervention(s): Hand expression;Skin to skin Intervention(s): Alternate breast massage  Type of Nipple: Everted at rest and  after stimulation (semi flat/short shaft)  Comfort (Breast/Nipple): Filling, red/small blisters or bruises, mild/mod discomfort  Problem noted: Mild/Moderate discomfort Interventions (Filling): Hand pump Interventions (Mild/moderate discomfort): Comfort gels;Breast shields;Post-pump;Hand expression;Hand massage  Hold (Positioning): Assistance needed to correctly position infant at breast and maintain latch. Intervention(s): Breastfeeding basics reviewed;Support Pillows;Position options;Skin to skin  LATCH Score: 5  Lactation Tools Discussed/Used Tools: Shells;Nipple Shields;Pump;Comfort gels Nipple shield size: 20 Shell Type: Inverted Breast pump type: Manual Pump Review: Setup, frequency, and cleaning;Milk Storage Initiated by:: RN Date initiated:: 10/02/14   Consult Status Consult Status: Follow-up    Theodoro Kalata 10/02/2014, 3:38 PM

## 2014-10-05 ENCOUNTER — Ambulatory Visit (HOSPITAL_COMMUNITY)
Admit: 2014-10-05 | Discharge: 2014-10-05 | Disposition: A | Discharge status: Home / Self Care | Payer: Medicaid Other | Attending: Obstetrics | Admitting: Obstetrics

## 2014-10-05 NOTE — Lactation Note (Signed)
Lactation Consult  Mother's reason for visit:  Mom did not stay for OP appointment. Mom thought appointment was free and did not want to stay due to charge. Mom reported her milk has come in. Baby is latching without the nipple shield. She is not having pain with breastfeeding and she is BF each feeding before giving any bottles. Mom denies any concerns. Engorgement care reviewed with Mom. Advised of Support Group and how to attend.  Visit Type:   Appointment Notes:  Consult:   Lactation Consultant:  Katrine Coho  ________________________________________________________________________    ________________________________________________________________________  Mother's Name: Ariel Barnes Type of delivery:   Breastfeeding Experience:   Maternal Medical Conditions:   Maternal Medications:    ________________________________________________________________________  Breastfeeding History (Post Discharge)  Frequency of breastfeeding:   Duration of feeding:      Infant Intake and Output Assessment  Voids:  in 24 hrs.  Color:   Stools:   in 24 hrs.  Color:    ________________________________________________________________________  Maternal Breast Assessment  Breast:   Nipple:   Pain level:   Pain interventions:    _______________________________________________________________________ Feeding Assessment/Evaluation  Initial feeding assessment:  Infant's oral assessment:   Positioning:     LATCH documentation:  Latch:    Audible swallowing:    Type of nipple:   Comfort (Breast/Nipple):    Hold (Positioning):    LATCH score:    Attached assessment:    Lips flanged:    Lips untucked:    Suck assessment:    Tools:   Instructed on use and cleaning of tool:    Pre-feed weight:   Post-feed weight Amount transferred:   Amount supplemented:      Total amount pumped post feed  Total amount transferred Total supplement given:

## 2018-01-11 ENCOUNTER — Encounter (HOSPITAL_BASED_OUTPATIENT_CLINIC_OR_DEPARTMENT_OTHER): Payer: Self-pay | Admitting: *Deleted

## 2018-01-11 ENCOUNTER — Emergency Department (HOSPITAL_BASED_OUTPATIENT_CLINIC_OR_DEPARTMENT_OTHER): Payer: Self-pay

## 2018-01-11 ENCOUNTER — Emergency Department (HOSPITAL_BASED_OUTPATIENT_CLINIC_OR_DEPARTMENT_OTHER)
Admission: EM | Admit: 2018-01-11 | Discharge: 2018-01-11 | Disposition: A | Discharge status: Home / Self Care | Payer: Self-pay | Attending: Emergency Medicine | Admitting: Emergency Medicine

## 2018-01-11 ENCOUNTER — Other Ambulatory Visit: Payer: Self-pay

## 2018-01-11 DIAGNOSIS — R05 Cough: Secondary | ICD-10-CM | POA: Insufficient documentation

## 2018-01-11 DIAGNOSIS — R059 Cough, unspecified: Secondary | ICD-10-CM

## 2018-01-11 LAB — PREGNANCY, URINE: Preg Test, Ur: NEGATIVE

## 2018-01-11 MED ORDER — ALBUTEROL SULFATE HFA 108 (90 BASE) MCG/ACT IN AERS: 2.0000 | INHALATION_SPRAY | RESPIRATORY_TRACT | 0 refills | Status: DC | PRN

## 2018-01-11 MED ORDER — BENZONATATE 100 MG PO CAPS: 100.0000 mg | ORAL_CAPSULE | Freq: Three times a day (TID) | ORAL | 0 refills | Status: DC

## 2018-01-11 MED FILL — PROVENTIL HFA 108 (90 BASE): 108 (90 BAS | 17 days supply | Qty: 7 | Fill #0

## 2018-01-11 MED FILL — BENZONATATE 100 MG CAPSULE: 100 | 7 days supply | Qty: 21 | Fill #0

## 2018-01-11 NOTE — ED Provider Notes (Signed)
[facility] EMERGENCY DEPARTMENT Provider Note   CSN: [id number] Arrival date & time: 01/11/18  1119     History   Chief Complaint Chief Complaint  Patient presents with  . Fever  . Cough    HPI Ariel Barnes is a 37 y.o. female.  HPI   Ariel Barnes is a 37 y.o. female, patient with no pertinent past medical history, presenting to the ED with productive cough for the last 3 days.  Occasional swelling beginning last night.  Occasional chest tightness.  No pleuritic chest pain. Denies fever/chills, N/V/D, abdominal pain, shortness of breath, or any other complaints.      Past Medical History:  Diagnosis Date  . Medical history non-contributory     Patient Active Problem List   Diagnosis Date Noted  . Active labor 10/01/2014  . NVD (normal vaginal delivery) 10/01/2014  . Pregnancy 09/21/2014    Past Surgical History:  Procedure Laterality Date  . NO PAST SURGERIES       OB History    Gravida  2   Para  2   Term  2   Preterm      AB      Living  1     SAB      TAB      Ectopic      Multiple  0   Live Births  1            Home Medications    Prior to Admission medications   Medication Sig Start Date End Date Taking? Authorizing Provider  albuterol (PROVENTIL HFA;VENTOLIN HFA) 108 (90 Base) MCG/ACT inhaler Inhale 2 puffs into the lungs every 4 (four) hours as needed for wheezing or shortness of breath. 01/11/18   Asahel Risden C, PA-C  benzonatate (TESSALON) 100 MG capsule Take 1 capsule (100 mg total) by mouth every 8 (eight) hours. 01/11/18   Halsey Hammen, Helane Gunther, PA-C    Family History No family history on file.  Social History Social History   Tobacco Use  . Smoking status: Never Smoker  . Smokeless tobacco: Never Used  Substance Use Topics  . Alcohol use: No  . Drug use: No     Allergies   Patient has no known allergies.   Review of Systems Review of Systems  Constitutional: Positive for diaphoresis.  Negative for chills and fever.  Respiratory: Positive for cough.   Cardiovascular: Negative for chest pain and leg swelling.  Gastrointestinal: Negative for abdominal pain, diarrhea, nausea and vomiting.  All other systems reviewed and are negative.    Physical Exam Updated Vital Signs BP 125/89   Pulse 68   Temp 98.2 F (36.8 C) (Oral)   Resp 20   Ht 5\' 4"  (1.626 m)   Wt 88 kg (194 lb)   LMP 09/20/2017 Comment: states her menses are irregular  SpO2 98%   BMI 33.30 kg/m   Physical Exam  Constitutional: She appears well-developed and well-nourished. No distress.  HENT:  Head: Normocephalic and atraumatic.  Eyes: Conjunctivae are normal.  Neck: Neck supple.  Cardiovascular: Normal rate, regular rhythm, normal heart sounds and intact distal pulses.  Pulmonary/Chest: Effort normal and breath sounds normal. No respiratory distress.  No increased work of breathing.  Patient speaks in full sentences without difficulty.  Abdominal: Soft. There is no tenderness. There is no guarding.  Musculoskeletal: She exhibits no edema.  Lymphadenopathy:    She has no cervical adenopathy.  Neurological: She is alert.  Skin:  Skin is warm and dry. She is not diaphoretic.  Psychiatric: She has a normal mood and affect. Her behavior is normal.  Nursing note and vitals reviewed.    ED Treatments / Results  Labs (all labs ordered are listed, but only abnormal results are displayed) Labs Reviewed  PREGNANCY, URINE    EKG None  Radiology Dg Chest 2 View  Result Date: 01/11/2018 CLINICAL DATA:  Cough and fever for 3 days EXAM: CHEST - 2 VIEW COMPARISON:  Chest x-ray of 01/30/2008 FINDINGS: No active infiltrate or effusion is seen. Mediastinal and hilar contours are unremarkable. The heart is within normal limits in size. No bony abnormality is seen. IMPRESSION: No active cardiopulmonary disease. Electronically Signed   By: Ivar Drape M.D.   On: 01/11/2018 12:32    Procedures Procedures  (including critical care time)  Medications Ordered in ED Medications - No data to display   Initial Impression / Assessment and Plan / ED Course  I have reviewed the triage vital signs and the nursing notes.  Pertinent labs & imaging results that were available during my care of the patient were reviewed by me and considered in my medical decision making (see chart for details).     Patient presents with 3 days of cough.  No increased work of breathing. Patient is nontoxic appearing, afebrile, not tachycardic, not tachypneic, not hypotensive, SPO2 of 98% on room air, and is in no apparent distress.  No acute abnormality on chest x-ray.  PCP follow-up as needed.  Resources given. The patient was given instructions for home care as well as return precautions. Patient voices understanding of these instructions, accepts the plan, and is comfortable with discharge.  Final Clinical Impressions(s) / ED Diagnoses   Final diagnoses:  Cough    ED Discharge Orders        Ordered    benzonatate (TESSALON) 100 MG capsule  Every 8 hours     01/11/18 1259    albuterol (PROVENTIL HFA;VENTOLIN HFA) 108 (90 Base) MCG/ACT inhaler  Every 4 hours PRN     01/11/18 [place], Antwan Bribiesca C, PA-C 01/11/18 1313    Orlie Dakin, MD 01/11/18 1544

## 2018-01-11 NOTE — ED Triage Notes (Signed)
Fever and cough x 3 days.

## 2018-01-11 NOTE — Discharge Instructions (Addendum)
There were no acute abnormalities noted on the chest x-ray.  Your symptoms are consistent with a viral illness. Viruses do not require antibiotics. Treatment is symptomatic care and it is important to note that these symptoms may last for 7-14 days.   Hand washing: Wash your hands throughout the day, but especially before and after touching the face, using the restroom, sneezing, coughing, or touching surfaces that have been coughed or sneezed upon. Hydration: Symptoms will be intensified and complicated by dehydration. Dehydration can also extend the duration of symptoms. Drink plenty of fluids and get plenty of rest. You should be drinking at least half a liter of water an hour to stay hydrated. Electrolyte drinks (ex. Gatorade, Powerade, Pedialyte) are also encouraged. You should be drinking enough fluids to make your urine light yellow, almost clear. If this is not the case, you are not drinking enough water. Please note that some of the treatments indicated below will not be effective if you are not adequately hydrated. Pain or fever: Ibuprofen, Naproxen, or Tylenol for pain or fever.  Cough: Use the Tessalon for cough.  Albuterol: May use the albuterol inhaler, as needed, for chest tightness or shortness of breath Zyrtec or Claritin: May add these medication daily to control underlying symptoms of congestion, sneezing, and other signs of allergies. Flonase: Use this medication, as directed, for nasal and sinus congestion. Congestion: Plain Mucinex may help relieve congestion. Saline sinus rinses and saline nasal sprays may also help relieve congestion. If you do not have heart problems or an allergy to such medications, you may also try phenylephrine or Sudafed. Sore throat: Warm liquids or Chloraseptic spray may help soothe a sore throat. Gargle twice a day with a salt water solution made from a half teaspoon of salt in a cup of warm water.  Follow up: Follow up with a primary care provider, as  needed, for any future management of this issue. Return: Return to the ED should symptoms worsen.

## 2018-03-09 ENCOUNTER — Emergency Department (HOSPITAL_BASED_OUTPATIENT_CLINIC_OR_DEPARTMENT_OTHER): Payer: No Typology Code available for payment source

## 2018-03-09 ENCOUNTER — Emergency Department (HOSPITAL_BASED_OUTPATIENT_CLINIC_OR_DEPARTMENT_OTHER)
Admission: EM | Admit: 2018-03-09 | Discharge: 2018-03-09 | Disposition: A | Discharge status: Home / Self Care | Payer: No Typology Code available for payment source | Attending: Emergency Medicine | Admitting: Emergency Medicine

## 2018-03-09 ENCOUNTER — Other Ambulatory Visit: Payer: Self-pay

## 2018-03-09 ENCOUNTER — Encounter (HOSPITAL_BASED_OUTPATIENT_CLINIC_OR_DEPARTMENT_OTHER): Payer: Self-pay | Admitting: Emergency Medicine

## 2018-03-09 DIAGNOSIS — Z79899 Other long term (current) drug therapy: Secondary | ICD-10-CM | POA: Insufficient documentation

## 2018-03-09 DIAGNOSIS — N3000 Acute cystitis without hematuria: Secondary | ICD-10-CM

## 2018-03-09 DIAGNOSIS — K8050 Calculus of bile duct without cholangitis or cholecystitis without obstruction: Secondary | ICD-10-CM

## 2018-03-09 LAB — COMPREHENSIVE METABOLIC PANEL
ALBUMIN: 4.2 g/dL (ref 3.5–5.0)
ALT: 19 U/L (ref 0–44)
AST: 20 U/L (ref 15–41)
Alkaline Phosphatase: 95 U/L (ref 38–126)
Anion gap: 6 (ref 5–15)
BILIRUBIN TOTAL: 0.5 mg/dL (ref 0.3–1.2)
BUN: 14 mg/dL (ref 6–20)
CHLORIDE: 108 mmol/L (ref 98–111)
CO2: 25 mmol/L (ref 22–32)
Calcium: 8.7 mg/dL — ABNORMAL LOW (ref 8.9–10.3)
Creatinine, Ser: 0.66 mg/dL (ref 0.44–1.00)
GFR calc Af Amer: >60 mL/min (ref >60)
GLUCOSE: 87 mg/dL (ref 70–99)
POTASSIUM: 3.8 mmol/L (ref 3.5–5.1)
Sodium: 139 mmol/L (ref 135–145)
Total Protein: 7.4 g/dL (ref 6.5–8.1)

## 2018-03-09 LAB — URINALYSIS, MICROSCOPIC (REFLEX)

## 2018-03-09 LAB — URINALYSIS, ROUTINE W REFLEX MICROSCOPIC
Bilirubin Urine: NEGATIVE
Glucose, UA: NEGATIVE mg/dL
HGB URINE DIPSTICK: NEGATIVE
KETONES UR: NEGATIVE mg/dL
Nitrite: NEGATIVE
PH: 5.5 (ref 5.0–8.0)
Protein, ur: NEGATIVE mg/dL
Specific Gravity, Urine: 1.025 (ref 1.005–1.030)

## 2018-03-09 LAB — LIPASE, BLOOD: Lipase: 30 U/L (ref 11–51)

## 2018-03-09 LAB — CBC
HCT: 42.3 % (ref 36.0–46.0)
Hemoglobin: 14.2 g/dL (ref 12.0–15.0)
MCH: 28.9 pg (ref 26.0–34.0)
MCHC: 33.6 g/dL (ref 30.0–36.0)
MCV: 86 fL (ref 78.0–100.0)
Platelets: 293 10*3/uL (ref 150–400)
RBC: 4.92 MIL/uL (ref 3.87–5.11)
RDW: 12.9 % (ref 11.5–15.5)
WBC: 7.2 10*3/uL (ref 4.0–10.5)

## 2018-03-09 LAB — PREGNANCY, URINE: Preg Test, Ur: NEGATIVE

## 2018-03-09 MED ORDER — DICYCLOMINE HCL 10 MG PO CAPS
10.0000 mg | ORAL_CAPSULE | Freq: Once | ORAL | Status: AC
  Administered 2018-03-09: 10 mg via ORAL
  Filled 2018-03-09: qty 1

## 2018-03-09 MED ORDER — DICYCLOMINE HCL 20 MG PO TABS: 20.0000 mg | ORAL_TABLET | Freq: Two times a day (BID) | ORAL | 0 refills | Status: DC

## 2018-03-09 MED ORDER — FOSFOMYCIN TROMETHAMINE 3 G PO PACK
3.0000 g | PACK | Freq: Once | ORAL | Status: AC
  Administered 2018-03-09: 3 g via ORAL
  Filled 2018-03-09: qty 3

## 2018-03-09 MED FILL — DICYCLOMINE 20 MG TABLET: 20 | 7 days supply | Qty: 14 | Fill #0

## 2018-03-09 NOTE — Discharge Instructions (Addendum)
[employer] he recetado pastillas para Conservation officer, historic buildings de su estomago. [employer] he dado un referral para el cirujano de la vesicula, llame para hacer una consulta y cita si los dolores [place].  SOLICITE ATENCIN MDICA DE INMEDIATO SI: Tiene un color [place] en la piel o en la zona blanca del ojo (ictericia). Tiene la [employer] t y las heces muy claras. Se siente mareado o se desmaya.

## 2018-03-09 NOTE — ED Provider Notes (Signed)
[facility]ER[facility] DEPARTMENT Provider Note   CSN: [id number] Arrival date & time: 03/09/18  1009     History   Chief Complaint Chief Complaint  Patient presents with  . Abdominal Pain    HPI Ariel Barnes is a 37 y.o. female.  37 y/o female with no PMH presents to the ED with a chief complaint of ABD pain x 8 months. Patient describes the pain as a constant burning feeling that begins on her abdomen and radiates to her back and pelvic region. She states the pain is worse when she gets agitated.Patient reports some nausea but denies any vomiting.She also reports frequent bowel movements (5xday) but denies diarrhea or blood in her stool. Patient was seen at another facility recently for the same complaint and she states they told her to improve her diet.Patient has tried improving her diet but has no relieve in symptoms. She has a paternal history of stomach CA. She denies any fever, vaginal discharge, urinary symptoms, or chest pain/shortness of breath.      Past Medical History:  Diagnosis Date  . Medical history non-contributory     Patient Active Problem List   Diagnosis Date Noted  . Active labor 10/01/2014  . NVD (normal vaginal delivery) 10/01/2014  . Pregnancy 09/21/2014    Past Surgical History:  Procedure Laterality Date  . NO PAST SURGERIES       OB History    Gravida  2   Para  2   Term  2   Preterm      AB      Living  1     SAB      TAB      Ectopic      Multiple  0   Live Births  1            Home Medications    Prior to Admission medications   Medication Sig Start Date End Date Taking? Authorizing Provider  albuterol (PROVENTIL HFA;VENTOLIN HFA) 108 (90 Base) MCG/ACT inhaler Inhale 2 puffs into the lungs every 4 (four) hours as needed for wheezing or shortness of breath. 01/11/18   Joy, Shawn C, PA-C  benzonatate (TESSALON) 100 MG capsule Take 1 capsule (100 mg total) by mouth every 8 (eight) hours. 01/11/18    Joy, Helane Gunther, PA-C    Family History History reviewed. No pertinent family history.  Social History Social History   Tobacco Use  . Smoking status: Never Smoker  . Smokeless tobacco: Never Used  Substance Use Topics  . Alcohol use: No  . Drug use: No     Allergies   Patient has no known allergies.   Review of Systems All other system reviewed were negative.Please see HPI.   Physical Exam Updated Vital Signs BP 133/82   Pulse 80   Temp 98.9 F (37.2 C) (Oral)   Resp 16   Ht 5\' 4"  (1.626 m)   Wt 81.6 kg (180 lb)   LMP 02/19/2018 (Exact Date)   SpO2 99%   BMI 30.90 kg/m   Physical Exam  Constitutional: She is oriented to person, place, and time. She appears well-developed and well-nourished.  HENT:  Head: Normocephalic and atraumatic.  Eyes: Pupils are equal, round, and reactive to light.  Cardiovascular: Normal rate.  Pulmonary/Chest: Effort normal and breath sounds normal. She has no wheezes.  Abdominal: Soft. Normal appearance and bowel sounds are normal. There is generalized tenderness and tenderness in the right upper quadrant and suprapubic  area. There is CVA tenderness. There is no rigidity, no tenderness at McBurney's point and negative Murphy's sign.  Neurological: She is alert and oriented to person, place, and time.  Skin: Skin is warm and dry.  Nursing note and vitals reviewed.    ED Treatments / Results  Labs (all labs ordered are listed, but only abnormal results are displayed) Labs Reviewed  COMPREHENSIVE METABOLIC PANEL - Abnormal; Notable for the following components:      Result Value   Calcium 8.7 (*)    All other components within normal limits  URINALYSIS, ROUTINE W REFLEX MICROSCOPIC - Abnormal; Notable for the following components:   Leukocytes, UA TRACE (*)    All other components within normal limits  URINALYSIS, MICROSCOPIC (REFLEX) - Abnormal; Notable for the following components:   Bacteria, UA MANY (*)    All other components  within normal limits  URINE CULTURE  LIPASE, BLOOD  CBC  PREGNANCY, URINE    EKG None  Radiology US Abdomen Limited  Result Date: 03/09/2018 CLINICAL DATA:  Abdominal pain. EXAM: ULTRASOUND ABDOMEN LIMITED RIGHT UPPER QUADRANT COMPARISON:  None. FINDINGS: Gallbladder: There is a single large mobile stone within the gallbladder measuring 1.5 cm. No gallbladder wall thickening or pericholecystic fluid. Negative sonographic Murphy's sign. Common bile duct: Diameter: 3.7 mm Liver: No focal lesion identified. Within normal limits in parenchymal echogenicity. Portal vein is patent on color Doppler imaging with normal direction of blood flow towards the liver. IMPRESSION: 1. Gallstone.  No secondary signs of acute cholecystitis identified. Electronically Signed   By: Kerby Moors M.D.   On: 03/09/2018 12:12    Procedures Procedures (including critical care time)  Medications Ordered in ED Medications  fosfomycin (MONUROL) packet 3 g (has no administration in time range)  dicyclomine (BENTYL) capsule 10 mg (10 mg Oral Given 03/09/18 1136)     Initial Impression / Assessment and Plan / ED Course  I have reviewed the triage vital signs and the nursing notes.  Pertinent labs & imaging results that were available during my care of the patient were reviewed by me and considered in my medical decision making (see chart for details).     Patient reports generalized abd pain, CMP showed no elevation of liver enzyme, no electrolyte abnormality. Lipase level was 30.Patient states the pain is worse in the RUQ, [place] limited ABD showed: single large mobile stone within the gallbladder measuring 1.5 cm. No acute cholecystitis identified. Patient not febrile or jaundice, I believe cholangitis less likely. UA showed many bacteria and trace of leukocytes. I have given patient bentyl for the pain, she states some relief. Due to her generalized pain I will treat her with fosfomycin in the ED and give her  referral for [facility] if her biliary colic symptoms persist. Return precautions provided    Final Clinical Impressions(s) / ED Diagnoses   Final diagnoses:  Biliary colic  Acute cystitis without hematuria    ED Discharge Orders    None       Jessicah, Croll, Hershal Coria 03/09/18 1307    Jola Schmidt, MD 03/10/18 1030

## 2018-03-09 NOTE — ED Triage Notes (Signed)
Reports generalized abdominal pain x 8 months.  States worse today.  Reports frequent bowel movements.  Reports the pain is constant.

## 2018-03-10 LAB — URINE CULTURE: Culture: <10000 — AB

## 2018-06-07 ENCOUNTER — Encounter (HOSPITAL_COMMUNITY): Payer: Self-pay | Admitting: Emergency Medicine

## 2018-06-07 ENCOUNTER — Emergency Department (HOSPITAL_COMMUNITY)
Admission: EM | Admit: 2018-06-07 | Discharge: 2018-06-07 | Disposition: A | Discharge status: Home / Self Care | Payer: Self-pay | Attending: Emergency Medicine | Admitting: Emergency Medicine

## 2018-06-07 ENCOUNTER — Emergency Department (HOSPITAL_COMMUNITY): Payer: Self-pay

## 2018-06-07 DIAGNOSIS — R1084 Generalized abdominal pain: Secondary | ICD-10-CM

## 2018-06-07 DIAGNOSIS — N739 Female pelvic inflammatory disease, unspecified: Secondary | ICD-10-CM | POA: Insufficient documentation

## 2018-06-07 LAB — URINALYSIS, ROUTINE W REFLEX MICROSCOPIC
Bilirubin Urine: NEGATIVE
Glucose, UA: NEGATIVE mg/dL
Hgb urine dipstick: NEGATIVE
Ketones, ur: NEGATIVE mg/dL
LEUKOCYTES UA: NEGATIVE
Nitrite: NEGATIVE
PROTEIN: NEGATIVE mg/dL
SPECIFIC GRAVITY, URINE: 1.012 (ref 1.005–1.030)
pH: 8 (ref 5.0–8.0)

## 2018-06-07 LAB — WET PREP, GENITAL
Clue Cells Wet Prep HPF POC: NONE SEEN
SPERM: NONE SEEN
TRICH WET PREP: NONE SEEN
YEAST WET PREP: NONE SEEN

## 2018-06-07 LAB — CBC
HCT: 43.7 % (ref 36.0–46.0)
HEMOGLOBIN: 13.9 g/dL (ref 12.0–15.0)
MCH: 28.2 pg (ref 26.0–34.0)
MCHC: 31.8 g/dL (ref 30.0–36.0)
MCV: 88.6 fL (ref 80.0–100.0)
NRBC: 0 % (ref 0.0–0.2)
Platelets: 347 10*3/uL (ref 150–400)
RBC: 4.93 MIL/uL (ref 3.87–5.11)
RDW: 12.5 % (ref 11.5–15.5)
WBC: 10.9 10*3/uL — AB (ref 4.0–10.5)

## 2018-06-07 LAB — TROPONIN I

## 2018-06-07 LAB — COMPREHENSIVE METABOLIC PANEL
ALBUMIN: 3.8 g/dL (ref 3.5–5.0)
ALT: 36 U/L (ref 0–44)
ANION GAP: 7 (ref 5–15)
AST: 22 U/L (ref 15–41)
Alkaline Phosphatase: 103 U/L (ref 38–126)
BILIRUBIN TOTAL: 1 mg/dL (ref 0.3–1.2)
BUN: 9 mg/dL (ref 6–20)
CO2: 25 mmol/L (ref 22–32)
Calcium: 8.9 mg/dL (ref 8.9–10.3)
Chloride: 107 mmol/L (ref 98–111)
Creatinine, Ser: 0.7 mg/dL (ref 0.44–1.00)
GFR calc non Af Amer: >60 mL/min (ref >60)
GLUCOSE: 89 mg/dL (ref 70–99)
Potassium: 3.6 mmol/L (ref 3.5–5.1)
Sodium: 139 mmol/L (ref 135–145)
TOTAL PROTEIN: 6.9 g/dL (ref 6.5–8.1)

## 2018-06-07 LAB — LIPASE, BLOOD: Lipase: 27 U/L (ref 11–51)

## 2018-06-07 LAB — I-STAT BETA HCG BLOOD, ED (MC, WL, AP ONLY)

## 2018-06-07 MED ORDER — DOXYCYCLINE HYCLATE 100 MG PO CAPS: 100.0000 mg | ORAL_CAPSULE | Freq: Two times a day (BID) | ORAL | 0 refills | Status: DC

## 2018-06-07 MED ORDER — DICYCLOMINE HCL 10 MG PO CAPS
20.0000 mg | ORAL_CAPSULE | Freq: Once | ORAL | Status: AC
  Administered 2018-06-07: 20 mg via ORAL
  Filled 2018-06-07: qty 2

## 2018-06-07 MED ORDER — STERILE WATER FOR INJECTION IJ SOLN
INTRAMUSCULAR | Status: AC
  Administered 2018-06-07: 0.9 mL
  Filled 2018-06-07: qty 10

## 2018-06-07 MED ORDER — ONDANSETRON HCL 4 MG/2ML IJ SOLN
4.0000 mg | Freq: Once | INTRAMUSCULAR | Status: AC
  Administered 2018-06-07: 4 mg via INTRAVENOUS
  Filled 2018-06-07: qty 2

## 2018-06-07 MED ORDER — CEFTRIAXONE SODIUM 250 MG IJ SOLR
250.0000 mg | Freq: Once | INTRAMUSCULAR | Status: AC
  Administered 2018-06-07: 250 mg via INTRAMUSCULAR
  Filled 2018-06-07: qty 250

## 2018-06-07 MED ORDER — GI COCKTAIL ~~LOC~~
30.0000 mL | Freq: Once | ORAL | Status: AC
  Administered 2018-06-07: 30 mL via ORAL
  Filled 2018-06-07: qty 30

## 2018-06-07 MED ORDER — IOHEXOL 300 MG/ML  SOLN
100.0000 mL | Freq: Once | INTRAMUSCULAR | Status: AC | PRN
  Administered 2018-06-07: 100 mL via INTRAVENOUS

## 2018-06-07 MED ORDER — DICYCLOMINE HCL 20 MG PO TABS: 20.0000 mg | ORAL_TABLET | Freq: Three times a day (TID) | ORAL | 0 refills | Status: DC | PRN

## 2018-06-07 MED ORDER — SODIUM CHLORIDE 0.9 % IV BOLUS
1000.0000 mL | Freq: Once | INTRAVENOUS | Status: AC
  Administered 2018-06-07: 1000 mL via INTRAVENOUS

## 2018-06-07 MED ORDER — AZITHROMYCIN 250 MG PO TABS
1000.0000 mg | ORAL_TABLET | Freq: Once | ORAL | Status: AC
  Administered 2018-06-07: 1000 mg via ORAL
  Filled 2018-06-07: qty 4

## 2018-06-07 NOTE — Discharge Instructions (Signed)
If given you 2 prescriptions today.  Bentyl as the medication to help with your stomach pains that I gave you while in the department.  Doxycycline is the antibiotic to help with your pelvic infection.  Please do not have sex, use tampons, or put anything in your vagina for three weeks.   You may have diarrhea from the antibiotics.  It is very important that you continue to take the antibiotics even if you get diarrhea unless a medical professional tells you that you may stop taking them.  If you stop too early the bacteria you are being treated for will become stronger and you may need different, more powerful antibiotics that have more side effects and worsening diarrhea.  Please stay well hydrated and consider probiotics as they may decrease the severity of your diarrhea.  Please be aware that if you take any hormonal contraception (birth control pills, nexplanon, the ring, etc) that your birth control will not work while you are taking antibiotics and you need to use back up protection as directed on the birth control medication information insert.

## 2018-06-07 NOTE — ED Notes (Signed)
Pt transported back to E39 from CT scanner on stretcher, tolerated well.

## 2018-06-07 NOTE — ED Notes (Signed)
ED Provider at bedside. 

## 2018-06-07 NOTE — ED Notes (Signed)
Pt in radiology at this time, will return to draw labs.

## 2018-06-07 NOTE — ED Notes (Signed)
Pt transported to CT on stretcher with tech.

## 2018-06-07 NOTE — ED Notes (Signed)
Patient verbalizes understanding of discharge instructions. Opportunity for questioning and answers were provided. Armband removed by staff, pt discharged from ED ambulatory.   

## 2018-06-07 NOTE — ED Provider Notes (Signed)
[facility] EMERGENCY DEPARTMENT Provider Note   CSN: [id number] Arrival date & time: 06/07/18  5852     History   Chief Complaint Chief Complaint  Patient presents with  . Abdominal Pain    HPI Ariel Barnes is a 37 y.o. female who presents today for evaluation of multiple complaints.  She reports that for approximately 1 year she has had right upper quadrant abdominal pain and nausea, and bloating.  She has been seen for this multiple times in the past.  She also has pain in the entire left side of her body from the top of her head all the way to her toes on the left side.  This has been present for approximately 1 year.  She reports that new today is significant left lower sided abdominal pain, urinary frequency, and vaginal discharge, with pain with sexual activity..  She reports that this is been going on for approximately 1 week. She reports no dysuria, or urgency.  Her vaginal discharge is yellow, she is sexually active with her husband.    HPI  Past Medical History:  Diagnosis Date  . Medical history non-contributory     Patient Active Problem List   Diagnosis Date Noted  . Active labor 10/01/2014  . NVD (normal vaginal delivery) 10/01/2014  . Pregnancy 09/21/2014    Past Surgical History:  Procedure Laterality Date  . NO PAST SURGERIES       OB History    Gravida  2   Para  2   Term  2   Preterm      AB      Living  1     SAB      TAB      Ectopic      Multiple  0   Live Births  1            Home Medications    Prior to Admission medications   Medication Sig Start Date End Date Taking? Authorizing Provider  albuterol (PROVENTIL HFA;VENTOLIN HFA) 108 (90 Base) MCG/ACT inhaler Inhale 2 puffs into the lungs every 4 (four) hours as needed for wheezing or shortness of breath. Patient not taking: Reported on 06/07/2018 01/11/18   Joy, Raquel Sarna C, PA-C  benzonatate (TESSALON) 100 MG capsule Take 1 capsule (100 mg total) by  mouth every 8 (eight) hours. Patient not taking: Reported on 06/07/2018 01/11/18   Joy, Helane Gunther, PA-C  dicyclomine (BENTYL) 20 MG tablet Take 1 tablet (20 mg total) by mouth 3 (three) times daily with meals as needed for up to 14 days for spasms. 06/07/18 06/21/18  Lorin Glass, PA-C  doxycycline (VIBRAMYCIN) 100 MG capsule Take 1 capsule (100 mg total) by mouth 2 (two) times daily for 14 days. 06/07/18 06/21/18  Lorin Glass, PA-C    Family History No family history on file.  Social History Social History   Tobacco Use  . Smoking status: Never Smoker  . Smokeless tobacco: Never Used  Substance Use Topics  . Alcohol use: No  . Drug use: No     Allergies   Patient has no known allergies.   Review of Systems Review of Systems  Constitutional: Negative for chills, fever and unexpected weight change.  HENT: Negative for ear pain and sore throat.   Eyes: Negative for pain and visual disturbance.  Respiratory: Negative for cough and shortness of breath.   Cardiovascular: Positive for chest pain (Left sided). Negative for palpitations.  Gastrointestinal: Negative  for abdominal pain, nausea and vomiting.  Genitourinary: Positive for dyspareunia, frequency, pelvic pain, urgency, vaginal discharge and vaginal pain. Negative for dysuria and hematuria.  Musculoskeletal: Negative for arthralgias and back pain.       Pain in bilateral lower back and entire left side of body from head to toe  Skin: Negative for color change and rash.  Neurological: Positive for headaches (Left sided.). Negative for seizures and syncope.  All other systems reviewed and are negative.    Physical Exam Updated Vital Signs BP 111/60   Pulse (!) 57   Temp 97.9 F (36.6 C) (Oral)   Resp 14   LMP 04/07/2018 (Approximate) Comment: irregular periods  SpO2 98%   Physical Exam  Constitutional: She appears well-developed and well-nourished.  Non-toxic appearance. No distress.  HENT:  Head:  Normocephalic and atraumatic.  Mouth/Throat: Oropharynx is clear and moist.  Eyes: Conjunctivae are normal.  Neck: Neck supple.  Cardiovascular: Normal rate and regular rhythm.  No murmur heard. Pulmonary/Chest: Effort normal and breath sounds normal. No respiratory distress.  Abdominal: Soft. Bowel sounds are normal. There is tenderness in the right lower quadrant, suprapubic area and left lower quadrant. There is no rigidity, no rebound and no CVA tenderness.  Genitourinary: Uterus is tender. Cervix exhibits motion tenderness and discharge. Right adnexum displays no mass, no tenderness and no fullness. Left adnexum displays no mass, no tenderness and no fullness. Vaginal discharge ( clear/yellow) found.  Genitourinary Comments: Pelvic exam performed with patient's primary RN as chaperone in room.  Musculoskeletal: She exhibits no edema.  Neurological: She is alert.  Skin: Skin is warm and dry.  Psychiatric: She has a normal mood and affect.  Nursing note and vitals reviewed.    ED Treatments / Results  Labs (all labs ordered are listed, but only abnormal results are displayed) Labs Reviewed  WET PREP, GENITAL - Abnormal; Notable for the following components:      Result Value   WBC, Wet Prep HPF POC MODERATE (*)    All other components within normal limits  CBC - Abnormal; Notable for the following components:   WBC 10.9 (*)    All other components within normal limits  LIPASE, BLOOD  COMPREHENSIVE METABOLIC PANEL  URINALYSIS, ROUTINE W REFLEX MICROSCOPIC  TROPONIN I  I-STAT BETA HCG BLOOD, ED ([facility], [facility], [facility] ONLY)  GC/CHLAMYDIA PROBE AMP ([place]) NOT AT [facility]    EKG None  Radiology Dg Chest 2 View  Result Date: 06/07/2018 CLINICAL DATA:  Exertional fatigue and shortness of breath after walking short distances. Chronic left-sided numbness and and pain today from head to toe over the past year with increasing severity last night. Mid abdominal pain last night. EXAM: CHEST  - 2 VIEW COMPARISON:  Chest x-ray of Jan 11, 2018 FINDINGS: The lungs are mildly hyperinflated. There is no pneumothorax, pneumomediastinum, or pleural effusion. The heart and pulmonary vascularity are normal. The mediastinum is normal in width. The bony thorax exhibits no acute abnormality. IMPRESSION: There is no pneumonia, CHF, nor other acute cardiopulmonary abnormality. Electronically Signed   By: David  Martinique M.D.   On: 06/07/2018 13:02   Ct Abdomen Pelvis W Contrast  Result Date: 06/07/2018 CLINICAL DATA:  Left-sided abdominal pain EXAM: CT ABDOMEN AND PELVIS WITH CONTRAST TECHNIQUE: Multidetector CT imaging of the abdomen and pelvis was performed using the standard protocol following bolus administration of intravenous contrast. CONTRAST:  171mL OMNIPAQUE IOHEXOL 300 MG/ML  SOLN COMPARISON:  Ultrasound from 03/09/2018 FINDINGS: Lower chest: No  acute abnormality. Hepatobiliary: Liver is mildly fatty infiltrated. The gallbladder is partially distended. The known gallstone is not well appreciated due to lack calcification. No biliary ductal dilatation is seen. Pancreas: Unremarkable. No pancreatic ductal dilatation or surrounding inflammatory changes. Spleen: Normal in size without focal abnormality. Adrenals/Urinary Tract: The adrenal glands are within normal limits. No renal or ureteral calculi are identified. The collecting systems are unremarkable. No renal mass is seen. The bladder is well distended. Stomach/Bowel: Stomach is within normal limits. Appendix appears normal. No evidence of bowel wall thickening, distention, or inflammatory changes. Vascular/Lymphatic: No significant vascular findings are present. No enlarged abdominal or pelvic lymph nodes. Reproductive: The uterus is retroflexed but otherwise within normal limits. Some nabothian cysts are noted. Other: No abdominal wall hernia or abnormality. No abdominopelvic ascites. Musculoskeletal: No acute or significant osseous findings.  IMPRESSION: Previously seen gallstone is not well appreciated on this exam due to lack of calcium. Mild fatty infiltration of the liver. No other focal abnormality is noted. Electronically Signed   By: Inez Catalina M.D.   On: 06/07/2018 14:16    Procedures Procedures (including critical care time)  Medications Ordered in ED Medications  sodium chloride 0.9 % bolus 1,000 mL (0 mLs Intravenous Stopped 06/07/18 1535)  dicyclomine (BENTYL) capsule 20 mg (20 mg Oral Given 06/07/18 1329)  gi cocktail (Maalox,Lidocaine,Donnatal) (30 mLs Oral Given 06/07/18 1330)  iohexol (OMNIPAQUE) 300 MG/ML solution 100 mL (100 mLs Intravenous Contrast Given 06/07/18 1400)  cefTRIAXone (ROCEPHIN) injection 250 mg (250 mg Intramuscular Given 06/07/18 1738)  azithromycin (ZITHROMAX) tablet 1,000 mg (1,000 mg Oral Given 06/07/18 1738)  ondansetron (ZOFRAN) injection 4 mg (4 mg Intravenous Given 06/07/18 1738)  sterile water (preservative free) injection (0.9 mLs  Given 06/07/18 1739)     Initial Impression / Assessment and Plan / ED Course  I have reviewed the triage vital signs and the nursing notes.  Pertinent labs & imaging results that were available during my care of the patient were reviewed by me and considered in my medical decision making (see chart for details).  Clinical Course as of Jun 07 1853  Tue Jun 07, 2018  1551 No chaperone available for pelvic   [EH]  1614 Pelvic exam completed, swabs have been sent.   [EH]    Clinical Course User Index [EH] Lorin Glass, PA-C   Patient presents today for evaluation of multiple complaints. She reports 1 year of entire left-sided body pain from the top of her head to the tip of her toes, including the left side of her chest and abdomen.  Her new concern is pain with sex, and pelvic pain. Chest x-ray and EKG were both obtained and reviewed without acute abnormalities.  Patient is PERC negative.  Given chronic nature of this pain extremely low  suspicion for a life-threatening acute cause of her symptoms.  For her left lower quadrant pain CT scan abdomen pelvis was performed showing fatty liver, without other acute abnormalities.  She was given Bentyl while in the department which significantly resolved her generalized abdominal pain.  She is given a prescription for this at home, along with GI follow up.  Discussed dietary changes including a low FODMAP diet.    She reports dyspareunia, pelvic pain with urinary urgency.  Urine was not consistent with infection.  On pelvic exam she had a large amount of discharge.  Wet prep was consistent for many white blood cells, and she had cervical motion tenderness.  Gonorrhea chlamydia testing was sent.  Will treat for PID.  She was advised to have pelvic rest for 3 weeks, and given OB/GYN follow-up.    Return precautions were discussed with patient who states their understanding.  At the time of discharge patient denied any unaddressed complaints or concerns.  Patient is agreeable for discharge home.   Final Clinical Impressions(s) / ED Diagnoses   Final diagnoses:  Generalized abdominal pain  Pelvic inflammatory disease (PID)    ED Discharge Orders         Ordered    doxycycline (VIBRAMYCIN) 100 MG capsule  2 times daily     06/07/18 1734    dicyclomine (BENTYL) 20 MG tablet  3 times daily with meals PRN     06/07/18 1734           Lorin Glass, PA-C 06/07/18 [place], [place], DO 06/07/18 2140

## 2018-06-07 NOTE — ED Triage Notes (Addendum)
Patient to ED c/o L sided abdominal pain onset yesterday (has occurred a few times over the last year, but on opposite side). Was seen at [facility] a few months ago and told she had a gallstone. She reports the pain feels similar to that but is now on the left. Endorses nausea, bloating, and urinary frequency as well. Denies vomiting or diarrhea, no fevers/chills.

## 2018-06-08 LAB — GC/CHLAMYDIA PROBE AMP (~~LOC~~) NOT AT ARMC
CHLAMYDIA, DNA PROBE: NEGATIVE
NEISSERIA GONORRHEA: NEGATIVE

## 2018-06-19 ENCOUNTER — Emergency Department (HOSPITAL_COMMUNITY)
Admission: EM | Admit: 2018-06-19 | Discharge: 2018-06-19 | Disposition: A | Discharge status: Home / Self Care | Payer: No Typology Code available for payment source | Attending: Emergency Medicine | Admitting: Emergency Medicine

## 2018-06-19 ENCOUNTER — Encounter (HOSPITAL_COMMUNITY): Payer: Self-pay | Admitting: *Deleted

## 2018-06-19 ENCOUNTER — Other Ambulatory Visit: Payer: Self-pay

## 2018-06-19 DIAGNOSIS — N898 Other specified noninflammatory disorders of vagina: Secondary | ICD-10-CM | POA: Insufficient documentation

## 2018-06-19 DIAGNOSIS — Z3202 Encounter for pregnancy test, result negative: Secondary | ICD-10-CM | POA: Insufficient documentation

## 2018-06-19 MED ORDER — DOXYCYCLINE HYCLATE 100 MG PO CAPS: 100.0000 mg | ORAL_CAPSULE | Freq: Two times a day (BID) | ORAL | 0 refills | Status: AC

## 2018-06-19 MED ORDER — FLUCONAZOLE 200 MG PO TABS: 200.0000 mg | ORAL_TABLET | Freq: Once | ORAL | 0 refills | Status: AC

## 2018-06-19 MED ORDER — FLUCONAZOLE 150 MG PO TABS
150.0000 mg | ORAL_TABLET | Freq: Once | ORAL | Status: AC
  Administered 2018-06-19: 150 mg via ORAL
  Filled 2018-06-19: qty 1

## 2018-06-19 NOTE — ED Provider Notes (Addendum)
[facility] EMERGENCY DEPARTMENT Provider Note   CSN: [id number] Arrival date & time: 06/19/18  1608     History   Chief Complaint Chief Complaint  Patient presents with  . needs another antibiotic    HPI Ariel Barnes is a 36 y.o. female presenting for concern of medication refill following diagnosis of PID.  Patient was seen on 06/07/2018 and diagnosed with PID.  At that time patient was treated with azithromycin, Rocephin and prescribed a 2-week course of doxycycline. Patient states that she took 8 days worth of doxycycline before stopping this 4 days ago.  Patient states that she feels better compared to her last visit however endorses white vaginal discharge and mild vaginal itching for the past few days.  Patient denies sexual intercourse since last visit.  Patient denies fever, abdominal pain, nausea/vomiting, decreased appetite, diarrhea, dysuria, hematuria, purulent vaginal discharge.  HPI  Past Medical History:  Diagnosis Date  . Medical history non-contributory     Patient Active Problem List   Diagnosis Date Noted  . Active labor 10/01/2014  . NVD (normal vaginal delivery) 10/01/2014  . Pregnancy 09/21/2014    Past Surgical History:  Procedure Laterality Date  . NO PAST SURGERIES       OB History    Gravida  2   Para  2   Term  2   Preterm      AB      Living  1     SAB      TAB      Ectopic      Multiple  0   Live Births  1            Home Medications    Prior to Admission medications   Medication Sig Start Date End Date Taking? Authorizing Provider  albuterol (PROVENTIL HFA;VENTOLIN HFA) 108 (90 Base) MCG/ACT inhaler Inhale 2 puffs into the lungs every 4 (four) hours as needed for wheezing or shortness of breath. Patient not taking: Reported on 06/07/2018 01/11/18   Joy, Raquel Sarna C, PA-C  benzonatate (TESSALON) 100 MG capsule Take 1 capsule (100 mg total) by mouth every 8 (eight) hours. Patient not taking:  Reported on 06/07/2018 01/11/18   Joy, Helane Gunther, PA-C  dicyclomine (BENTYL) 20 MG tablet Take 1 tablet (20 mg total) by mouth 3 (three) times daily with meals as needed for up to 14 days for spasms. 06/07/18 06/21/18  Lorin Glass, PA-C  doxycycline (VIBRAMYCIN) 100 MG capsule Take 1 capsule (100 mg total) by mouth 2 (two) times daily. 06/19/18   Nuala Alpha A, PA-C  fluconazole (DIFLUCAN) 200 MG tablet Take 1 tablet (200 mg total) by mouth once for 1 dose. Take after you finish your antibiotic doxycycline. 06/19/18 06/19/18  Deliah Boston, PA-C    Family History No family history on file.  Social History Social History   Tobacco Use  . Smoking status: Never Smoker  . Smokeless tobacco: Never Used  Substance Use Topics  . Alcohol use: No  . Drug use: No     Allergies   Patient has no known allergies.   Review of Systems Review of Systems  Constitutional: Negative.  Negative for chills and fever.  Gastrointestinal: Negative.  Negative for abdominal pain, blood in stool, diarrhea, nausea and vomiting.  Genitourinary: Positive for vaginal discharge. Negative for dysuria, hematuria, pelvic pain and vaginal bleeding.       Vaginal itching  Skin: Negative.  Negative for rash.  Physical Exam Updated Vital Signs BP (!) 121/92 (BP Location: Right Arm)   Pulse 70   Temp 98.8 F (37.1 C) (Oral)   Resp 15   Ht 5\' 4"  (1.626 m)   Wt 83.9 kg   LMP 04/19/2018   SpO2 100%   BMI 31.76 kg/m   Physical Exam  Constitutional: She appears well-developed and well-nourished. No distress.  HENT:  Head: Normocephalic and atraumatic.  Right Ear: External ear normal.  Left Ear: External ear normal.  Nose: Nose normal.  Eyes: Pupils are equal, round, and reactive to light. EOM are normal.  Neck: Trachea normal and normal range of motion. No tracheal deviation present.  Pulmonary/Chest: Effort normal. No respiratory distress.  Abdominal: Soft. There is no tenderness. There  is no rigidity, no rebound, no guarding and no CVA tenderness.  Genitourinary:  Genitourinary Comments: Deferred by patient, states that she was to follow-up with the women's health clinic.  Musculoskeletal: Normal range of motion.  Neurological: She is alert. GCS eye subscore is 4. GCS verbal subscore is 5. GCS motor subscore is 6.  Speech is clear and goal oriented, follows commands Major Cranial nerves without deficit, no facial droop Normal strength in upper and lower extremities bilaterally including dorsiflexion and plantar flexion, strong and equal grip strength Sensation normal to light touch Moves extremities without ataxia, coordination intact Normal gait  Skin: Skin is warm and dry.  Psychiatric: She has a normal mood and affect. Her behavior is normal.   ED Treatments / Results  Labs (all labs ordered are listed, but only abnormal results are displayed) Labs Reviewed - No data to display  EKG None  Radiology No results found.  Procedures Procedures (including critical care time)  Medications Ordered in ED Medications  fluconazole (DIFLUCAN) tablet 150 mg (150 mg Oral Given 06/19/18 2018)     Initial Impression / Assessment and Plan / ED Course  I have reviewed the triage vital signs and the nursing notes.  Pertinent labs & imaging results that were available during my care of the patient were reviewed by me and considered in my medical decision making (see chart for details).  Clinical Course as of Jun 19 2021  Nancy Fetter Jun 19, 2018  1923 Discussed case with Dr. Ronnald Nian; agrees with continuation of doxycycline and giving patient Diflucan for presumed yeast infection.   [BM]    Clinical Course User Index [BM] Deliah Boston, PA-C   Patient has been offered pelvic examination today to evaluate for cause of vaginal itching/discharge.  She has refused pelvic examination multiple times and states that she wants to follow-up with the women's clinic  instead.  Patient is afebrile, not tachycardic, not hypotensive, well-appearing in no acute distress.  Abdominal examination is without tenderness.  Based on history of multiple recent antibiotic use and initial improvement of vaginal discharge however new vaginal itching and white discharge it is likely that the patient is experiencing a yeast infection. Patient denies sexual intercourse since last visit. Patient to be treated with Diflucan today and once after cessation of antibiotic therapy.  Patient has been given a refill of doxycycline and informed to finish all this medication. Due to previous adequate treatment and no history of sexual intercourse, doubt reinfection at this time. Patient informed that she may return to the emergency department at any time if she changes her mind and wishes to have pelvic examination performed. Patient informed to follow-up with OB/GYN as soon as possible for pelvic examination and reevaluation.  Patient with negative pregnancy test during last visit, denies sexual intercourse since that time and does not wish to have another pregnancy test today. Patient informed to avoid sexual intercourse until she finishes prescription of doxycycline and diflucan and to have all parties tested and treated.  At this time there does not appear to be any evidence of an acute emergency medical condition and the patient appears stable for discharge with appropriate outpatient follow up. Diagnosis was discussed with patient who verbalizes understanding of care plan and is agreeable to discharge. I have discussed return precautions with patient who verbalizes understanding of return precautions. Patient strongly encouraged to follow-up with their PCP. All questions answered.  Patient's case discussed with Dr. Ronnald Nian who agrees with plan of diflucan, doxycycline and discharge with follow-up.     Note: Portions of this report may have been transcribed using voice recognition software.  Every effort was made to ensure accuracy; however, inadvertent computerized transcription errors may still be present.  Final Clinical Impressions(s) / ED Diagnoses   Final diagnoses:  Vaginal discharge    ED Discharge Orders         Ordered    fluconazole (DIFLUCAN) 200 MG tablet   Once     06/19/18 1951    doxycycline (VIBRAMYCIN) 100 MG capsule  2 times daily     06/19/18 1951           Gari Crown 06/19/18 2030    Nuala Alpha A, PA-C 06/19/18 2030    Lennice Sites, DO 06/19/18 2339

## 2018-06-19 NOTE — ED Notes (Signed)
Reviewed d/c instructions with pt, who verbalized understanding and had no outstanding questions. Pt departed in NAD, refused use of wheelchair.   

## 2018-06-19 NOTE — Discharge Instructions (Addendum)
Please return to the Emergency Department for any new or worsening symptoms or if your symptoms do not improve. Please be sure to follow up with your Primary Care Physician as soon as possible regarding your visit today. If you do not have a Primary Doctor please use the resources below to establish one. You have refused pelvic examination today to evaluate your discharge/itching.  You may return to the emergency department at any time to have pelvic examination performed. It is possible today that your vaginal discharge/itching is due to a yeast infection.  You have been given a dose of Diflucan today for yeast infection.  You may use the second dose after you finish taking your antibiotic doxycycline. Please take the new prescription of doxycycline, 2 times daily for the next 10 days for your previously diagnosed pelvic inflammatory disease. Please follow-up with your OB/GYN as soon as possible for reevaluation/pelvic examination. Both medications doxycycline and Diflucan can negatively affect pregnancy.  Please avoid sexual intercourse until you have finished your medication treatment.  SOLICITE ATENCIN MDICA SI: Jaclynn Guarneri. Los sntomas desaparecen y Teacher, adult education. Los sntomas no mejoran con Dispensing optician. Los sntomas empeoran. Aparecen nuevos sntomas. Aparecen ampollas alrededor o adentro de la vagina. Le sale sangre de la vagina y no est menstruando. Siente dolor en el abdomen. SOLICITE ATENCIN MDICA SI:  Siente dolor abdominal. Tiene fiebre o sntomas persistentes durante ms de 2  3 das. Tiene fiebre y los sntomas empeoran repentinamente.  Do not take your medicine if  develop an itchy rash, swelling in your mouth or lips, or difficulty breathing.   RESOURCE GUIDE  Chronic Pain Problems: Contact [facility]  [phone] Patients need to be referred by their primary care doctor.  Insufficient Money for Medicine: Contact [employer]:  call "211"  or [place] [phone].  No Primary Care Doctor: Call [facility]  [phone] - can help you locate a primary care doctor that  accepts your insurance, provides certain services, etc. Physician Referral Service- [phone]  Agencies that provide inexpensive medical care: [facility]  [facility]  [phone] [facility]  [phone] [facility]  [phone] Planned [facility]  [phone] [facility]  [phone]  [place] Providers: [facility]- [address], [address]  [phone], Mon-Fri 9am-7pm, Sat 9am-1pm [place], [address] [place]  [place], [address] [place]  [place]- [address]  [place], [address], [phone]  Only accepts [place] patients after they have their name  applied to their card  Self Pay (no insurance) in [facility]: Sickle Cell Patients: Dr Kevan Ny, [facility]  [place], [facility]- [place]  [facility] [place]- [address], [facility]- see information above (Speak to [employer] if you do not have insurance)       -  [facility]- [place], [place],  [place],  [phone]       -  Dr Vista Lawman-  [address], [place], [place]- [address], [phone]       -  [facility] [place]- [address], [place], also [address] [phone]       -    [facility]- [address], [place], 1st & 3rd Saturday   every month, 10am-1pm  1) Find a  Doctor and Pay Out of Pocket Although you won't have to find out who is covered by your insurance plan, it is a good idea to ask around and get recommendations. You will then need to call the office and see if the doctor you have chosen will accept you as a new patient and what types of options they offer for patients who are self-pay. Some doctors offer discounts or will set up payment plans for their patients who do not have insurance, but you will need to ask so you aren't surprised when you get to your appointment.  2) Contact Your Local Health Department Not all health departments have doctors that can see patients for sick visits, but many do, so it is worth a call to see if yours does. If you don't know where your local health department is, you can check in your phone book. The CDC also has a tool to help you locate your state's health department, and many state websites also have listings of all of their local health departments.  3) Find a [facility] If your illness is not likely to be very severe or complicated, you may want to try a walk in clinic. These are popping up all over the country in pharmacies, drugstores, and shopping centers. They're usually staffed by nurse practitioners or physician assistants that have been trained to treat common illnesses and complaints. They're usually fairly quick and inexpensive. However, if you have serious medical issues or chronic medical problems, these are probably not your best option  [facility] [place], [facility], [address] [place], phone [phone] or [phone].  Monday - Friday, call for an appointment. Rafael Capo, [facility], [address], [place], phone [phone] or [phone].  Monday - Friday, call for an appointment.  Abuse/Neglect: [place] [phone] [place]  [phone] (After Hours)  Emergency Shelter:  [facility] [phone]  Maternity Homes: Room at the [place] [phone] [place] [phone]  MRSA Hotline #:   302-835-1032  [facility] Dept. [address]         [place] Phone:  [phone]  Phone:  [phone]                   Phone:  [place], [place]- [phone]       -     [facility] in [place], [address]                                  [place] [phone] or [phone] (After Hours)   [place]  Substance Abuse Resources: [facility] and Drug Services  [phone] [place] [phone] [facility] [place] Chinita Pester [phone] Residential & Outpatient Substance Abuse Program  [phone]  Psychological Services: [place]  [phone] [place]  [place], [place]. [address], [place], [place]: [phone] or [phone], [employer]  Dental Assistance  If unable to pay or uninsured, contact:  [facility] or [facility]. to become qualified for the adult dental clinic.  Patients with Medicaid: [facility] [phone] [address], [place] [address] [phone]  If unable to pay, or uninsured, contact [facility] [phone]) or [place] [phone] in [place], [place] in [facility]) to become  qualified for the adult dental clinic   Other [place]- [place], [place], [place], [address], [place], [place], 2nd and 4th Thursday of the month at 6:30am.  10 clients each day by appointment, can sometimes see walk-in patients if someone does not show for an appointment. [facility]- [address] [place], [place], [address], [place], [place], [place], [address], [place] Department- [phone] [place] [facility] [phone]

## 2018-06-19 NOTE — ED Triage Notes (Signed)
The pt was seen here Thursday for a pelvic infection  She was given an antibiotic  Which she stopped on the 23rd of October  She did not understand that she needed to take for 2 weeks  shes here to get another full rx  lmp 2 months ago  She has irregular  periods

## 2019-04-25 DIAGNOSIS — U071 COVID-19: Secondary | ICD-10-CM

## 2019-04-25 HISTORY — DX: COVID-19: U07.1

## 2019-11-28 ENCOUNTER — Other Ambulatory Visit: Payer: Self-pay

## 2019-11-28 ENCOUNTER — Emergency Department (HOSPITAL_BASED_OUTPATIENT_CLINIC_OR_DEPARTMENT_OTHER)
Admission: EM | Admit: 2019-11-28 | Discharge: 2019-11-28 | Disposition: A | Discharge status: Home / Self Care | Payer: Self-pay | Attending: Emergency Medicine | Admitting: Emergency Medicine

## 2019-11-28 ENCOUNTER — Encounter (HOSPITAL_BASED_OUTPATIENT_CLINIC_OR_DEPARTMENT_OTHER): Payer: Self-pay | Admitting: Emergency Medicine

## 2019-11-28 ENCOUNTER — Emergency Department (HOSPITAL_BASED_OUTPATIENT_CLINIC_OR_DEPARTMENT_OTHER): Payer: No Typology Code available for payment source

## 2019-11-28 DIAGNOSIS — Y999 Unspecified external cause status: Secondary | ICD-10-CM | POA: Insufficient documentation

## 2019-11-28 DIAGNOSIS — X500XXA Overexertion from strenuous movement or load, initial encounter: Secondary | ICD-10-CM | POA: Insufficient documentation

## 2019-11-28 DIAGNOSIS — S46819A Strain of other muscles, fascia and tendons at shoulder and upper arm level, unspecified arm, initial encounter: Secondary | ICD-10-CM

## 2019-11-28 DIAGNOSIS — Y9301 Activity, walking, marching and hiking: Secondary | ICD-10-CM | POA: Insufficient documentation

## 2019-11-28 DIAGNOSIS — M62838 Other muscle spasm: Secondary | ICD-10-CM

## 2019-11-28 DIAGNOSIS — S46811A Strain of other muscles, fascia and tendons at shoulder and upper arm level, right arm, initial encounter: Secondary | ICD-10-CM | POA: Insufficient documentation

## 2019-11-28 DIAGNOSIS — Y929 Unspecified place or not applicable: Secondary | ICD-10-CM | POA: Insufficient documentation

## 2019-11-28 DIAGNOSIS — S46812A Strain of other muscles, fascia and tendons at shoulder and upper arm level, left arm, initial encounter: Secondary | ICD-10-CM | POA: Insufficient documentation

## 2019-11-28 MED ORDER — KETOROLAC TROMETHAMINE 60 MG/2ML IM SOLN
60.0000 mg | Freq: Once | INTRAMUSCULAR | Status: AC
  Administered 2019-11-28: 60 mg via INTRAMUSCULAR
  Filled 2019-11-28: qty 2

## 2019-11-28 MED ORDER — TRAMADOL HCL 50 MG PO TABS: 50.0000 mg | ORAL_TABLET | Freq: Four times a day (QID) | ORAL | 0 refills | Status: DC | PRN

## 2019-11-28 MED ORDER — PREDNISONE 50 MG PO TABS: 50.0000 mg | ORAL_TABLET | Freq: Every day | ORAL | 0 refills | Status: DC

## 2019-11-28 MED ORDER — CYCLOBENZAPRINE HCL 10 MG PO TABS: 10.0000 mg | ORAL_TABLET | Freq: Every day | ORAL | 0 refills | Status: AC

## 2019-11-28 MED FILL — predniSONE 50 MG TABS: 50 | 5 days supply | Qty: 5 | Fill #0

## 2019-11-28 MED FILL — CYCLOBENZAPRINE HCL 10 MG T: 10 | 10 days supply | Qty: 10 | Fill #0

## 2019-11-28 MED FILL — traMADol HCL 50 MG TABS: 50 | 5 days supply | Qty: 20 | Fill #0

## 2019-11-28 NOTE — ED Triage Notes (Signed)
Three days ago pt was "stretching my neck" and got sudden pain. Pain from middle upper back, up neck and into back of head.  Pain also in shoulders.  Also has HA.

## 2019-11-28 NOTE — ED Provider Notes (Signed)
[facility] EMERGENCY DEPARTMENT Provider Note   CSN: [id number] Arrival date & time: 11/28/19  [id number]     History Chief Complaint  Patient presents with  . Neck Pain    Ariel Barnes is a 39 y.o. female.  HPI Patient presents to the emergency department with neck pain that started after stretching her neck while walking.  Patient states this started last week and is continued to get worse.  Patient states she cannot take any medications that help relieve her symptoms prior to arrival.  She states she did try some Tylenol and ibuprofen without relief.     Past Medical History:  Diagnosis Date  . Medical history non-contributory     Patient Active Problem List   Diagnosis Date Noted  . Active labor 10/01/2014  . NVD (normal vaginal delivery) 10/01/2014  . Pregnancy 09/21/2014    Past Surgical History:  Procedure Laterality Date  . NO PAST SURGERIES       OB History    Gravida  2   Para  2   Term  2   Preterm      AB      Living  1     SAB      TAB      Ectopic      Multiple  0   Live Births  1           No family history on file.  Social History   Tobacco Use  . Smoking status: Never Smoker  . Smokeless tobacco: Never Used  Substance Use Topics  . Alcohol use: No  . Drug use: No    Home Medications Prior to Admission medications   Medication Sig Start Date End Date Taking? Authorizing Provider  albuterol (PROVENTIL HFA;VENTOLIN HFA) 108 (90 Base) MCG/ACT inhaler Inhale 2 puffs into the lungs every 4 (four) hours as needed for wheezing or shortness of breath. Patient not taking: Reported on 06/07/2018 01/11/18   Joy, Raquel Sarna C, PA-C  benzonatate (TESSALON) 100 MG capsule Take 1 capsule (100 mg total) by mouth every 8 (eight) hours. Patient not taking: Reported on 06/07/2018 01/11/18   Joy, Helane Gunther, PA-C  dicyclomine (BENTYL) 20 MG tablet Take 1 tablet (20 mg total) by mouth 3 (three) times daily with meals as needed for up to  14 days for spasms. 06/07/18 06/21/18  Lorin Glass, PA-C  doxycycline (VIBRAMYCIN) 100 MG capsule Take 1 capsule (100 mg total) by mouth 2 (two) times daily. 06/19/18   Deliah Boston, PA-C    Allergies    Patient has no known allergies.  Review of Systems   Review of Systems All other systems negative except as documented in the HPI. All pertinent positives and negatives as reviewed in the HPI. Physical Exam Updated Vital Signs BP 118/68 (BP Location: Right Arm)   Pulse (!) 55   Temp 98.2 F (36.8 C) (Oral)   Resp 18   Ht 5\' 4"  (1.626 m)   Wt 87.8 kg   LMP 11/08/2019   SpO2 98%   BMI 33.23 kg/m   Physical Exam Vitals and nursing note reviewed.  Constitutional:      General: She is not in acute distress.    Appearance: She is well-developed.  HENT:     Head: Normocephalic and atraumatic.  Eyes:     Pupils: Pupils are equal, round, and reactive to light.  Pulmonary:     Effort: Pulmonary effort is normal.  Musculoskeletal:  Cervical back: Tenderness present. No deformity, signs of trauma, rigidity, spasms or bony tenderness.       Back:  Skin:    General: Skin is warm and dry.  Neurological:     Mental Status: She is alert and oriented to person, place, and time.     Sensory: Sensation is intact. No sensory deficit.     Motor: No weakness.     Coordination: Coordination normal.     ED Results / Procedures / Treatments   Labs (all labs ordered are listed, but only abnormal results are displayed) Labs Reviewed - No data to display  EKG None  Radiology CT Head Wo Contrast  Result Date: 11/28/2019 CLINICAL DATA:  Headache, normal neurological examination, neck pain into back of head and upper middle back, pain in shoulders EXAM: CT HEAD WITHOUT CONTRAST TECHNIQUE: Contiguous axial images were obtained from the base of the skull through the vertex without intravenous contrast. COMPARISON:  09/15/2003 FINDINGS: Brain: Normal ventricular morphology.  No midline shift or mass effect. Normal appearance of brain parenchyma. No intracranial hemorrhage, mass lesion, evidence of acute infarction, or extra-axial fluid collection. Vascular: No hyperdense vessels Skull: Intact Sinuses/Orbits: Clear Other: N/A IMPRESSION: Normal exam. Electronically Signed   By: Lavonia Dana M.D.   On: 11/28/2019 12:37    Procedures Procedures (including critical care time)  Medications Ordered in ED Medications  ketorolac (TORADOL) injection 60 mg (60 mg Intramuscular Given 11/28/19 1254)    ED Course  I have reviewed the triage vital signs and the nursing notes.  Pertinent labs & imaging results that were available during my care of the patient were reviewed by me and considered in my medical decision making (see chart for details).    MDM Rules/Calculators/A&P                      Final Clinical Impression(s) / ED Diagnoses Final diagnoses:  None   Patient has cervical strain follows the trapezius muscle and her headache I think is related to the spasming of the trapezius and the scalp muscles.  I advised the patient of the plan and all questions were answered.  Told to return here as needed patient has normal neurological function and strength in her upper extremities. Rx / DC Orders ED Discharge Orders    None       Dalia Heading, PA-C 11/28/19 1304    Lucrezia Starch, MD 11/30/19 [phone]

## 2019-11-28 NOTE — Discharge Instructions (Addendum)
Your CT scan did not show any abnormalities today.  This pain is most likely related to strain of the musculature involving the scalp and neck.  This can cause headaches and we will treat for this.  Return here as needed for any worsening in your condition.  Use ice and heat on your neck and upper back.

## 2020-01-13 ENCOUNTER — Emergency Department (HOSPITAL_BASED_OUTPATIENT_CLINIC_OR_DEPARTMENT_OTHER): Payer: HRSA Program

## 2020-01-13 ENCOUNTER — Encounter (HOSPITAL_BASED_OUTPATIENT_CLINIC_OR_DEPARTMENT_OTHER): Payer: Self-pay | Admitting: Emergency Medicine

## 2020-01-13 ENCOUNTER — Other Ambulatory Visit: Payer: Self-pay

## 2020-01-13 ENCOUNTER — Emergency Department (HOSPITAL_BASED_OUTPATIENT_CLINIC_OR_DEPARTMENT_OTHER)
Admission: EM | Admit: 2020-01-13 | Discharge: 2020-01-13 | Disposition: A | Discharge status: Home / Self Care | Payer: HRSA Program | Attending: Emergency Medicine | Admitting: Emergency Medicine

## 2020-01-13 DIAGNOSIS — Z79899 Other long term (current) drug therapy: Secondary | ICD-10-CM | POA: Diagnosis not present

## 2020-01-13 DIAGNOSIS — Z20822 Contact with and (suspected) exposure to covid-19: Secondary | ICD-10-CM | POA: Diagnosis not present

## 2020-01-13 DIAGNOSIS — R0602 Shortness of breath: Secondary | ICD-10-CM | POA: Diagnosis present

## 2020-01-13 MED ORDER — AEROCHAMBER PLUS FLO-VU MEDIUM MISC
1.0000 | Freq: Once | Status: DC
  Filled 2020-01-13: qty 1

## 2020-01-13 MED ORDER — ALBUTEROL SULFATE HFA 108 (90 BASE) MCG/ACT IN AERS
2.0000 | INHALATION_SPRAY | Freq: Once | RESPIRATORY_TRACT | Status: AC
  Administered 2020-01-13: 2 via RESPIRATORY_TRACT
  Filled 2020-01-13: qty 6.7

## 2020-01-13 MED ORDER — DEXAMETHASONE SODIUM PHOSPHATE 10 MG/ML IJ SOLN
10.0000 mg | Freq: Once | INTRAMUSCULAR | Status: AC
  Administered 2020-01-13: 10 mg via INTRAMUSCULAR
  Filled 2020-01-13: qty 1

## 2020-01-13 MED ORDER — PREDNISONE 10 MG (21) PO TBPK: ORAL_TABLET | ORAL | 0 refills | Status: AC

## 2020-01-13 NOTE — ED Notes (Signed)
ED Provider at bedside. 

## 2020-01-13 NOTE — ED Triage Notes (Signed)
SOB since Friday. She had a COVID exposure, neg test yesterday.

## 2020-01-13 NOTE — ED Notes (Signed)
Pt discharged to home. Discharge instructions have been discussed with patient and/or family members. Pt verbally acknowledges understanding d/c instructions, and endorses comprehension to checkout at registration before leaving.  °

## 2020-01-13 NOTE — ED Provider Notes (Signed)
[facility] Provider Note   CSN: [id number] Arrival date & time: 01/13/20  1549     History Chief Complaint  Patient presents with  . Shortness of Breath    Ariel Barnes is a 39 y.o. female.  Pt presents to the ED with sob.  The pt was exposed to Covid by her church pastor about 2 weeks ago.  She has been sob for the past week.  She went to an UC yesterday and had a Covid Ag test which was negative.  Pt continues to have sob and does not feel well.   No f/c.  She has not had the vaccine.        Past Medical History:  Diagnosis Date  . Medical history non-contributory     Patient Active Problem List   Diagnosis Date Noted  . Active labor 10/01/2014  . NVD (normal vaginal delivery) 10/01/2014  . Pregnancy 09/21/2014    Past Surgical History:  Procedure Laterality Date  . NO PAST SURGERIES       OB History    Gravida  2   Para  2   Term  2   Preterm      AB      Living  1     SAB      TAB      Ectopic      Multiple  0   Live Births  1           No family history on file.  Social History   Tobacco Use  . Smoking status: Never Smoker  . Smokeless tobacco: Never Used  Substance Use Topics  . Alcohol use: No  . Drug use: No    Home Medications Prior to Admission medications   Medication Sig Start Date End Date Taking? Authorizing Provider  albuterol (PROVENTIL HFA;VENTOLIN HFA) 108 (90 Base) MCG/ACT inhaler Inhale 2 puffs into the lungs every 4 (four) hours as needed for wheezing or shortness of breath. Patient not taking: Reported on 06/07/2018 01/11/18   Joy, Raquel Sarna C, PA-C  benzonatate (TESSALON) 100 MG capsule Take 1 capsule (100 mg total) by mouth every 8 (eight) hours. Patient not taking: Reported on 06/07/2018 01/11/18   Arlean Hopping C, PA-C  cyclobenzaprine (FLEXERIL) 10 MG tablet Take 1 tablet (10 mg total) by mouth at bedtime. 11/28/19   Lawyer, Harrell Gave, PA-C  dicyclomine (BENTYL) 20 MG tablet  Take 1 tablet (20 mg total) by mouth 3 (three) times daily with meals as needed for up to 14 days for spasms. 06/07/18 06/21/18  Lorin Glass, PA-C  doxycycline (VIBRAMYCIN) 100 MG capsule Take 1 capsule (100 mg total) by mouth 2 (two) times daily. 06/19/18   Deliah Boston, PA-C  predniSONE (STERAPRED UNI-PAK 21 TAB) 10 MG (21) TBPK tablet Take 6 tabs for 2 days, then 5 for 2 days, then 4 for 2 days, then 3 for 2 days, 2 for 2 days, then 1 for 2 days 01/13/20   Isla Pence, MD  traMADol (ULTRAM) 50 MG tablet Take 1 tablet (50 mg total) by mouth every 6 (six) hours as needed for severe pain. 11/28/19   Lawyer, Harrell Gave, PA-C    Allergies    Patient has no known allergies.  Review of Systems   Review of Systems  Respiratory: Positive for shortness of breath.   All other systems reviewed and are negative.   Physical Exam Updated Vital Signs BP 122/76 (BP Location: Right Arm)  Pulse 68   Temp 98.7 F (37.1 C)   Resp 20   Ht 5\' 4"  (1.626 m)   Wt 86.2 kg   LMP 12/31/2019   SpO2 97%   BMI 32.61 kg/m   Physical Exam Vitals and nursing note reviewed.  Constitutional:      Appearance: She is well-developed.  HENT:     Head: Normocephalic and atraumatic.     Mouth/Throat:     Mouth: Mucous membranes are moist.     Pharynx: Oropharynx is clear.  Eyes:     Extraocular Movements: Extraocular movements intact.     Pupils: Pupils are equal, round, and reactive to light.  Cardiovascular:     Rate and Rhythm: Normal rate and regular rhythm.  Pulmonary:     Effort: Pulmonary effort is normal.     Breath sounds: Normal breath sounds.  Abdominal:     General: Bowel sounds are normal.     Palpations: Abdomen is soft.  Musculoskeletal:        General: Normal range of motion.     Cervical back: Normal range of motion and neck supple.  Skin:    General: Skin is warm.     Capillary Refill: Capillary refill takes less than 2 seconds.  Neurological:     General: No focal  deficit present.     Mental Status: She is alert and oriented to person, place, and time.  Psychiatric:        Mood and Affect: Mood normal.        Behavior: Behavior normal.     ED Results / Procedures / Treatments   Labs (all labs ordered are listed, but only abnormal results are displayed) Labs Reviewed  SARS CORONAVIRUS 2 (TAT 6-24 HRS)    EKG EKG Interpretation  Date/Time:  Saturday Jan 13 2020 16:02:06 EDT Ventricular Rate:  78 PR Interval:  138 QRS Duration: 84 QT Interval:  346 QTC Calculation: 394 R Axis:   69 Text Interpretation: Normal sinus rhythm Normal ECG No significant change since last tracing Confirmed by Isla Pence [phone]) on 01/13/2020 4:10:13 PM   Radiology DG Chest Portable 1 View  Result Date: 01/13/2020 CLINICAL DATA:  Shortness of breath. Congestion. COVID-19 exposure 2 weeks ago. EXAM: PORTABLE CHEST 1 VIEW COMPARISON:  June 07, 2018 FINDINGS: The heart size and mediastinal contours are within normal limits. Both lungs are clear. The visualized skeletal structures are unremarkable. IMPRESSION: No active disease. Electronically Signed   By: Dorise Bullion III M.D   On: 01/13/2020 17:32    Procedures Procedures (including critical care time)  Medications Ordered in ED Medications  AeroChamber Plus Flo-Vu Medium MISC 1 each (has no administration in time range)  dexamethasone (DECADRON) injection 10 mg (10 mg Intramuscular Given 01/13/20 1745)  albuterol (VENTOLIN HFA) 108 (90 Base) MCG/ACT inhaler 2 puff (2 puffs Inhalation Given 01/13/20 1722)    ED Course  I have reviewed the triage vital signs and the nursing notes.  Pertinent labs & imaging results that were available during my care of the patient were reviewed by me and considered in my medical decision making (see chart for details).    MDM Rules/Calculators/A&P                      I suspect pt has covid.  She is instructed to continue to isolate until results come back.  Pt is  oxygenating at 100% and looks well.  Pt is stable for d/c.  Return if worse.  Ariel Barnes was evaluated in Emergency Department on 01/13/2020 for the symptoms described in the history of present illness. She was evaluated in the context of the global COVID-19 pandemic, which necessitated consideration that the patient might be at risk for infection with the SARS-CoV-2 virus that causes COVID-19. Institutional protocols and algorithms that pertain to the evaluation of patients at risk for COVID-19 are in a state of rapid change based on information released by regulatory bodies including the CDC and federal and state organizations. These policies and algorithms were followed during the patient's care in the ED. Final Clinical Impression(s) / ED Diagnoses Final diagnoses:  Suspected COVID-19 virus infection    Rx / DC Orders ED Discharge Orders         Ordered    predniSONE (STERAPRED UNI-PAK 21 TAB) 10 MG (21) TBPK tablet     01/13/20 1802           Isla Pence, MD 01/13/20 1840

## 2020-01-14 LAB — SARS CORONAVIRUS 2 (TAT 6-24 HRS): SARS Coronavirus 2: NEGATIVE

## 2020-03-11 ENCOUNTER — Telehealth (INDEPENDENT_AMBULATORY_CARE_PROVIDER_SITE_OTHER): Payer: HRSA Program | Admitting: Family Medicine

## 2020-03-11 DIAGNOSIS — R109 Unspecified abdominal pain: Secondary | ICD-10-CM

## 2020-03-11 NOTE — Telephone Encounter (Signed)
Patient called into the office want to schedule an appointment for her abdominal pain. Patient was given an appointment for 8/6. Patient requested to speak to a nurse in regards to her pain to see if there is something she can do since the appointment is so far out. Patient instructed that a message will be sent to the nurses and they will contact her. Patient verbalized understanding and message sent to clinical pool.

## 2020-03-15 NOTE — Telephone Encounter (Signed)
Called patient with pacific interpreter [phone] stating I am returning her phone call. Patient states she has an appt on 8/6 and wants to know if she can have a physical/annual with pap smear that day. Patient states the other day she was having a lot of back pain. She went to a clinic and was told she had UTI & was given an antibiotic. Patient reports taking an antibiotic for 2 days now and is starting to feel better. She wants to know if she can be checked for this again at her appt on 8/6. Patient also states she has been having a white/clear discharge without itching or odor. Discussed with patient her discharge could be normal then. Discussed normal vs abnormal discharge with patient. Told patient we can do a physical/pap on 8/6 but she would get a large bill without having insurance. Advised she contact [facility] for appt at upcoming free pap screening on Monday. Told patient if her UTI symptoms do not improve or vaginal discharge becomes irritating she can come see [place]. Patient verbalized understanding to all & had no other questions.

## 2020-03-29 ENCOUNTER — Encounter: Payer: Self-pay | Admitting: Obstetrics and Gynecology

## 2020-03-31 NOTE — Progress Notes (Signed)
Patient did not keep her GYN referral appointment for 03/29/2020.  Durene Romans MD Attending Center for [employer])

## 2020-04-27 ENCOUNTER — Other Ambulatory Visit: Payer: Self-pay

## 2020-04-27 ENCOUNTER — Emergency Department (HOSPITAL_BASED_OUTPATIENT_CLINIC_OR_DEPARTMENT_OTHER)
Admission: EM | Admit: 2020-04-27 | Discharge: 2020-04-27 | Disposition: A | Discharge status: Home / Self Care | Payer: HRSA Program | Attending: Emergency Medicine | Admitting: Emergency Medicine

## 2020-04-27 ENCOUNTER — Emergency Department (HOSPITAL_BASED_OUTPATIENT_CLINIC_OR_DEPARTMENT_OTHER): Payer: HRSA Program

## 2020-04-27 ENCOUNTER — Encounter (HOSPITAL_BASED_OUTPATIENT_CLINIC_OR_DEPARTMENT_OTHER): Payer: Self-pay | Admitting: *Deleted

## 2020-04-27 DIAGNOSIS — R05 Cough: Secondary | ICD-10-CM | POA: Diagnosis present

## 2020-04-27 DIAGNOSIS — U071 COVID-19: Secondary | ICD-10-CM | POA: Insufficient documentation

## 2020-04-27 DIAGNOSIS — R059 Cough, unspecified: Secondary | ICD-10-CM

## 2020-04-27 DIAGNOSIS — Z79899 Other long term (current) drug therapy: Secondary | ICD-10-CM | POA: Insufficient documentation

## 2020-04-27 MED ORDER — ALBUTEROL SULFATE HFA 108 (90 BASE) MCG/ACT IN AERS
2.0000 | INHALATION_SPRAY | Freq: Once | RESPIRATORY_TRACT | Status: AC
  Administered 2020-04-27: 2 via RESPIRATORY_TRACT
  Filled 2020-04-27: qty 6.7

## 2020-04-27 MED ORDER — ACETAMINOPHEN 325 MG PO TABS
650.0000 mg | ORAL_TABLET | Freq: Once | ORAL | Status: AC | PRN
  Administered 2020-04-27: 650 mg via ORAL
  Filled 2020-04-27: qty 2

## 2020-04-27 NOTE — ED Notes (Signed)
Pr walked 4 laps in the room on the monitor. Pt O2 sat was 99 and heart rate was 80

## 2020-04-27 NOTE — ED Triage Notes (Signed)
Cough and fever x 10 days. Dx with Covid 4 days ago. Started on zithromax yesterday. Reports she is noticing blood in her phlegm when she coughs

## 2020-04-27 NOTE — ED Provider Notes (Signed)
[facility] EMERGENCY DEPARTMENT Provider Note   CSN: [id number] Arrival date & time: 04/27/20  1518     History Chief Complaint  Patient presents with  . Cough    Covid +    Ariel Barnes is a 39 y.o. female who is COVID positive presenting to the ED today with ongoing symptoms. Pt reports she continues to have a nonproductive cough, body aches, and fatigue.  Patient reports she began feeling ill on 8/25 and tested positive a couple of days later.  She has been self isolating at home however continues to feel ill.  She was prescribed a Z-Pak and Tessalon Perles however she states is not helping.  Patient states when she coughs she has a small amount of blood-tinged sputum mixed within the phlegm.  She denies any shortness of breath or chest pain.  She states she is here today because she does not know why she still feels bad after 9 to 10 days of symptoms.  Patient is unvaccinated.  She is here with her husband.   The history is provided by the patient and medical records.       Past Medical History:  Diagnosis Date  . Medical history non-contributory     Patient Active Problem List   Diagnosis Date Noted  . Active labor 10/01/2014  . NVD (normal vaginal delivery) 10/01/2014  . Pregnancy 09/21/2014    Past Surgical History:  Procedure Laterality Date  . NO PAST SURGERIES       OB History    Gravida  2   Para  2   Term  2   Preterm      AB      Living  1     SAB      TAB      Ectopic      Multiple  0   Live Births  1           No family history on file.  Social History   Tobacco Use  . Smoking status: Never Smoker  . Smokeless tobacco: Never Used  Substance Use Topics  . Alcohol use: No  . Drug use: No    Home Medications Prior to Admission medications   Medication Sig Start Date End Date Taking? Authorizing Provider  albuterol (PROVENTIL HFA;VENTOLIN HFA) 108 (90 Base) MCG/ACT inhaler Inhale 2 puffs into the lungs every  4 (four) hours as needed for wheezing or shortness of breath. Patient not taking: Reported on 06/07/2018 01/11/18   Joy, Raquel Sarna C, PA-C  benzonatate (TESSALON) 100 MG capsule Take 1 capsule (100 mg total) by mouth every 8 (eight) hours. Patient not taking: Reported on 06/07/2018 01/11/18   Arlean Hopping C, PA-C  cyclobenzaprine (FLEXERIL) 10 MG tablet Take 1 tablet (10 mg total) by mouth at bedtime. 11/28/19   Lawyer, Harrell Gave, PA-C  dicyclomine (BENTYL) 20 MG tablet Take 1 tablet (20 mg total) by mouth 3 (three) times daily with meals as needed for up to 14 days for spasms. 06/07/18 06/21/18  Lorin Glass, PA-C  doxycycline (VIBRAMYCIN) 100 MG capsule Take 1 capsule (100 mg total) by mouth 2 (two) times daily. 06/19/18   Deliah Boston, PA-C  predniSONE (STERAPRED UNI-PAK 21 TAB) 10 MG (21) TBPK tablet Take 6 tabs for 2 days, then 5 for 2 days, then 4 for 2 days, then 3 for 2 days, 2 for 2 days, then 1 for 2 days 01/13/20   Isla Pence, MD  traMADol Veatrice Bourbon)  50 MG tablet Take 1 tablet (50 mg total) by mouth every 6 (six) hours as needed for severe pain. 11/28/19   Lawyer, Harrell Gave, PA-C    Allergies    Patient has no known allergies.  Review of Systems   Review of Systems  Constitutional: Positive for chills, fatigue and fever.  Respiratory: Positive for cough. Negative for shortness of breath.   Cardiovascular: Negative for chest pain.  Gastrointestinal: Negative for abdominal pain, diarrhea, nausea and vomiting.  All other systems reviewed and are negative.   Physical Exam Updated Vital Signs BP (!) 144/88 (BP Location: Right Arm)   Pulse 71   Temp 98.2 F (36.8 C) (Oral)   Resp 20   Ht 5\' 4"  (1.626 m)   Wt 83.9 kg   LMP 04/16/2020   SpO2 100%   BMI 31.76 kg/m   Physical Exam Vitals and nursing note reviewed.  Constitutional:      Appearance: She is obese. She is not ill-appearing or diaphoretic.  HENT:     Head: Normocephalic and atraumatic.  Eyes:      Conjunctiva/sclera: Conjunctivae normal.  Cardiovascular:     Rate and Rhythm: Normal rate and regular rhythm.  Pulmonary:     Effort: Pulmonary effort is normal.     Breath sounds: Normal breath sounds. No wheezing, rhonchi or rales.     Comments: Actively coughing in the room. Able to speak in full sentences without difficulty. Satting 97% on room air at rest, ambulated patient and her O2 sats remained above 97%.  Her lungs are clear to auscultation bilaterally. Skin:    General: Skin is warm and dry.     Coloration: Skin is not jaundiced.  Neurological:     Mental Status: She is alert.     ED Results / Procedures / Treatments   Labs (all labs ordered are listed, but only abnormal results are displayed) Labs Reviewed - No data to display  EKG None  Radiology DG Chest [facility] 1 View  Result Date: 04/27/2020 CLINICAL DATA:  COVID positive.  Symptomatic for 10 days. EXAM: PORTABLE CHEST 1 VIEW COMPARISON:  01/13/2020 FINDINGS: Subtle hazy airspace opacities are noted in the lower lungs, right greater than left. Remainder of the lungs is clear. No pleural effusion or pneumothorax. Cardiac silhouette is normal in size. Normal mediastinal and hilar contours. Skeletal structures are grossly intact. IMPRESSION: 1. Subtle hazy airspace lung opacities in the lower lungs consistent with multifocal pneumonia, pattern compatible with COVID-19 infection. Electronically Signed   By: Lajean Manes M.D.   On: 04/27/2020 17:05    Procedures Procedures (including critical care time)  Medications Ordered in ED Medications  acetaminophen (TYLENOL) tablet 650 mg (650 mg Oral Given 04/27/20 1557)  albuterol (VENTOLIN HFA) 108 (90 Base) MCG/ACT inhaler 2 puff (2 puffs Inhalation Given 04/27/20 2128)    ED Course  I have reviewed the triage vital signs and the nursing notes.  Pertinent labs & imaging results that were available during my care of the patient were reviewed by me and considered in my medical  decision making (see chart for details).    MDM Rules/Calculators/A&P                          39 year old female who presents to the ED today after testing positive for COVID-19 a couple of days ago.  Began feeling ill on 08/25 and has been self quarantining at home since.  Comes in today with  persistent symptoms.  On arrival to the ED patient is afebrile, nontachycardic nontachypneic.  She appears to be in no acute distress.  She is able to speak in full sentences without difficulty.  She is actively coughing in the room however satting 97% on room air.  I personally was in the room while patient was being ambulated to pulse oximeter and she remained above 97%.  Chest x-ray was obtained while patient was in the waiting room which did show concern for Covid pneumonia.  She states she is taking Best [employer] and a Z-Pak at home prescribed by her PCP.  Patient had a lot of questions regarding COVID-19 and why we are not giving her any medications to treat this.  I had lengthy discussion with her regarding the fact that she has a pneumonia related to a virus and therefore antibiotics will not help with this.  I have offered an inhaler to help with her symptoms however I have instructed that she probably has another 4 to 5 days of this.  Unfortunately given her symptoms have been ongoing for about 10 days she does not qualify for Mab and she did not test positive in the ED today.  I have instructed that she continue self isolating at home and monitoring her symptoms.  Instructed to return for any worsening symptoms.  Patient stable for discharge home.   This note was prepared using Dragon voice recognition software and may include unintentional dictation errors due to the inherent limitations of voice recognition software.  Marialuisa Veneda Barnes was evaluated in Emergency Department on 04/27/2020 for the symptoms described in the history of present illness. She was evaluated in the context of the global COVID-19  pandemic, which necessitated consideration that the patient might be at risk for infection with the SARS-CoV-2 virus that causes COVID-19. Institutional protocols and algorithms that pertain to the evaluation of patients at risk for COVID-19 are in a state of rapid change based on information released by regulatory bodies including the CDC and federal and state organizations. These policies and algorithms were followed during the patient's care in the ED.  Final Clinical Impression(s) / ED Diagnoses Final diagnoses:  [id number]    Rx / DC Orders ED Discharge Orders    None       Discharge Instructions     Please continue using OTC Medications including Tylenol as needed for fever > 100.4 and Ibuprofen for body aches. You can use the albuterol inhaler every 4 hours as needed for shortness of breath/chest tightness. I would recommend cough drops and OTC cough medication as well. You likely have another few days of symptoms before you start feeling better. Drink plenty of fluids to stay hydrated. Monitor your symptoms at home and if you feel like you are developing worsening shortness of breath you will need to return to the ED IMMEDIATELY.        Eustaquio Maize, PA-C 04/27/20 2317    Tegeler, Gwenyth Allegra, MD 04/27/20 [phone]

## 2020-04-27 NOTE — Discharge Instructions (Signed)
Please continue using OTC Medications including Tylenol as needed for fever > 100.4 and Ibuprofen for body aches. You can use the albuterol inhaler every 4 hours as needed for shortness of breath/chest tightness. I would recommend cough drops and OTC cough medication as well. You likely have another few days of symptoms before you start feeling better. Drink plenty of fluids to stay hydrated. Monitor your symptoms at home and if you feel like you are developing worsening shortness of breath you will need to return to the ED IMMEDIATELY.

## 2020-04-27 NOTE — ED Notes (Signed)
ED Provider at bedside. 

## 2020-04-27 NOTE — ED Notes (Signed)
Here for increase in covid symptoms, has strong non-prod cough, was tested for COVID at [facility] and was positive for covid

## 2020-06-03 ENCOUNTER — Other Ambulatory Visit (HOSPITAL_COMMUNITY)
Admission: RE | Admit: 2020-06-03 | Discharge: 2020-06-03 | Disposition: A | Discharge status: Home / Self Care | Payer: Self-pay | Source: Ambulatory Visit | Attending: Obstetrics and Gynecology | Admitting: Obstetrics and Gynecology

## 2020-06-03 ENCOUNTER — Other Ambulatory Visit: Payer: Self-pay

## 2020-06-03 ENCOUNTER — Encounter: Payer: Self-pay | Admitting: Obstetrics and Gynecology

## 2020-06-03 ENCOUNTER — Ambulatory Visit (INDEPENDENT_AMBULATORY_CARE_PROVIDER_SITE_OTHER): Payer: Self-pay | Admitting: Obstetrics and Gynecology

## 2020-06-03 VITALS — BP 151/95 | HR 89 | Wt 191.8 lb

## 2020-06-03 DIAGNOSIS — I10 Essential (primary) hypertension: Secondary | ICD-10-CM

## 2020-06-03 DIAGNOSIS — N760 Acute vaginitis: Secondary | ICD-10-CM

## 2020-06-03 DIAGNOSIS — Z789 Other specified health status: Secondary | ICD-10-CM | POA: Insufficient documentation

## 2020-06-03 DIAGNOSIS — B373 Candidiasis of vulva and vagina: Secondary | ICD-10-CM

## 2020-06-03 DIAGNOSIS — N800 Endometriosis of uterus: Secondary | ICD-10-CM

## 2020-06-03 DIAGNOSIS — B3731 Acute candidiasis of vulva and vagina: Secondary | ICD-10-CM

## 2020-06-03 DIAGNOSIS — B9689 Other specified bacterial agents as the cause of diseases classified elsewhere: Secondary | ICD-10-CM

## 2020-06-03 DIAGNOSIS — N8003 Adenomyosis of the uterus: Secondary | ICD-10-CM

## 2020-06-03 DIAGNOSIS — D259 Leiomyoma of uterus, unspecified: Secondary | ICD-10-CM | POA: Insufficient documentation

## 2020-06-03 DIAGNOSIS — R1030 Lower abdominal pain, unspecified: Secondary | ICD-10-CM | POA: Insufficient documentation

## 2020-06-03 DIAGNOSIS — Z6833 Body mass index (BMI) 33.0-33.9, adult: Secondary | ICD-10-CM

## 2020-06-03 DIAGNOSIS — N926 Irregular menstruation, unspecified: Secondary | ICD-10-CM

## 2020-06-03 DIAGNOSIS — R319 Hematuria, unspecified: Secondary | ICD-10-CM

## 2020-06-03 LAB — POCT URINALYSIS DIP (DEVICE)
Bilirubin Urine: NEGATIVE
Glucose, UA: NEGATIVE mg/dL
Ketones, ur: NEGATIVE mg/dL
Nitrite: NEGATIVE
Protein, ur: NEGATIVE mg/dL
Specific Gravity, Urine: 1.02 (ref 1.005–1.030)
Urobilinogen, UA: 0.2 mg/dL (ref 0.0–1.0)
pH: 6 (ref 5.0–8.0)

## 2020-06-03 LAB — POCT PREGNANCY, URINE: Preg Test, Ur: NEGATIVE

## 2020-06-03 MED ORDER — METRONIDAZOLE 500 MG PO TABS: 500.0000 mg | ORAL_TABLET | Freq: Two times a day (BID) | ORAL | 0 refills | Status: AC

## 2020-06-03 MED ORDER — FLUCONAZOLE 150 MG PO TABS: 150.0000 mg | ORAL_TABLET | Freq: Once | ORAL | 0 refills | Status: AC

## 2020-06-03 NOTE — Progress Notes (Signed)
Last two months menstrual was regular but usually irregular  Having abdominal pain

## 2020-06-03 NOTE — Progress Notes (Signed)
Obstetrics and Gynecology New Patient Evaluation  Appointment Date: 06/03/2020  OBGYN Clinic: [facility]  Primary Care Provider: [facility] [place]  Referring Provider: [facility]  Chief Complaint:  Chief Complaint  Patient presents with  . Abdominal Pain    History of Present Illness: Ariel Barnes is a 39 y.o. Hispanic [id number] (LMP: 9/24), seen for the above chief complaint. Her past medical history is significant for ?HTN, likely PCOS  No records sent from PCP so history from patient  She states she started having lower abdominal pain that started about 2-3 months ago. She states it feels like she's about to have a period but she doesn't have one. It happens irregularly with last time yesterday for 30 minutes an nothing last week . She states she was seen by her PCP last month and had a UTI and sent in abx but s/s persisted. She states that she had a test of cure and it was negative.   She had an u/s by her PCP that was negative that showed ?adenomyosis.   Review of Systems: Pertinent items noted in HPI and remainder of comprehensive ROS otherwise negative.    Past Medical History:  Past Medical History:  Diagnosis Date  . BMI 33.0-33.9,adult   . COVID-19 04/2019  . Hypertension     Past Surgical History:  Past Surgical History:  Procedure Laterality Date  . NO PAST SURGERIES      Past Obstetrical History:  OB History  Gravida Para Term Preterm AB Living  2 2 2     1   SAB TAB Ectopic Multiple Live Births        0 1    # Outcome Date GA Lbr Len/2nd Weight Sex Delivery Anes PTL Lv  2 Term 10/01/14 [redacted]w[redacted]d 09:17 / 00:15 7 lb 3.9 oz (3.285 kg) M Vag-Spont EPI  LIV     Birth Comments: within normal limits  1 Term            SVD x 2  Past Gynecological History: As per HPI. Periods: every other month, 6 days, not heavy or painful. Periods have been like this since menarche.  History of Pap Smear(s):  unknown She is currently using no method for contraception.   Social History:  Social History   Socioeconomic History  . Marital status: Married    Spouse name: Not on file  . Number of children: Not on file  . Years of education: Not on file  . Highest education level: Not on file  Occupational History  . Not on file  Tobacco Use  . Smoking status: Never Smoker  . Smokeless tobacco: Never Used  Substance and Sexual Activity  . Alcohol use: No  . Drug use: No  . Sexual activity: Yes    Birth control/protection: None  Other Topics Concern  . Not on file  Social History Narrative  . Not on file   Social Determinants of Health   Financial Resource Strain:   . Difficulty of Paying Living Expenses: Not on file  Food Insecurity: No Food Insecurity  . Worried About Charity fundraiser in the Last Year: Never true  . Ran Out of Food in the Last Year: Never true  Transportation Needs: No Transportation Needs  . Lack of Transportation (Medical): No  . Lack of Transportation (Non-Medical): No  Physical Activity:   . Days of Exercise per Week: Not on file  . Minutes of Exercise per  Session: Not on file  Stress:   . Feeling of Stress : Not on file  Social Connections:   . Frequency of Communication with Friends and Family: Not on file  . Frequency of Social Gatherings with Friends and Family: Not on file  . Attends Religious Services: Not on file  . Active Member of Clubs or Organizations: Not on file  . Attends Archivist Meetings: Not on file  . Marital Status: Not on file  Intimate Partner Violence: Not At Risk  . Fear of Current or Ex-Partner: No  . Emotionally Abused: No  . Physically Abused: No  . Sexually Abused: No    Family History: No family history on file.   Medications None  Allergies Patient has no known allergies.   Physical Exam:  BP (!) 151/95   Pulse 89   Wt 191 lb 12.8 oz (87 kg)   BMI 32.92 kg/m  Body mass index is 32.92  kg/m. General appearance: Well nourished, well developed female in no acute distress.  Neck:  Supple, normal appearance, and no thyromegaly  Cardiovascular: normal s1 and s2.  No murmurs, rubs or gallops. Respiratory:  Clear to auscultation bilateral. Normal respiratory effort Abdomen: positive bowel sounds and no masses, hernias; diffusely non tender to palpation, non distended Neuro/Psych:  Normal mood and affect.  Skin:  Warm and dry.  Lymphatic:  No inguinal lymphadenopathy.   Pelvic exam: is not limited by body habitus EGBUS: within normal limits Vagina: within normal limits and with no blood in vault. +white cottage cheese like d/c in vault Cervix: normal appearing cervix without tenderness, discharge or lesions. Uterus:  nonenlarged and non tender Adnexa:  normal adnexa and no mass, fullness, tenderness Rectovaginal: deferred  Laboratory:  UPT negative U/a: hgb and leuks  Radiology:  CLINICAL DATA: Pelvic pain with abnormal menses   EXAM:  TRANSABDOMINAL AND TRANSVAGINAL ULTRASOUND OF PELVIS   TECHNIQUE:  Both transabdominal and transvaginal ultrasound examinations of the  pelvis were performed. Transabdominal technique was performed for  global imaging of the pelvis including uterus, ovaries, adnexal  regions, and pelvic cul-de-sac. It was necessary to proceed with  endovaginal exam following the transabdominal exam to visualize the  uterus endometrium ovaries.   COMPARISON: None   FINDINGS:  Uterus   Measurements: 8 x 4.7 x 5.6 cm = volume: 108.4 mL. No fibroids or  other mass visualized. Multiple nabothian cysts in the cervix.  Slightly bulky appearance of the uterine fundus with linear areas of  shadowing.   Endometrium   Thickness: 9.1 mm. No focal abnormality visualized.   Right ovary   Measurements: 3.7 x 2.7 x 1.6 cm = volume: 7.4 mL. Probable  involuting corpus luteum.   Left ovary   Measurements: 4.2 x 1.9 x 1.1 cm = volume: 4.4 mL. Normal   appearance/no adnexal mass.   Other findings   Small free fluid   IMPRESSION:  1. Normal premenopausal endometrial thickness.  2. Slightly bulky appearance of uterine fundus with heterogeneous  shadowing, possible adenomyosis.  3. Small free fluid in pelvis    Electronically Signed  By: Donavan Foil M.D.  On: 04/16/2020 15:16  Assessment: pt stable  Plan: 1. Hypertension, unspecified type  2. BMI 33.0-33.9,adult  3. Language barrier interpreter used  4. Adenomyosis See below  5. Lower abdominal pain I d/w her that I would recommend hormone management and if s/s persist after 3-50m then get an MRI to assess for adenomyosis. She states she is done with  childbearing. She is self-pay. I told her I'd prefer a Mirena, which she states she's had in the past w/o issue. I told her to try the HD to see about getting it cheaper there and if so then to get it placed with depo provera being 2nd choice. I told her that if she can't get those covered then can try progestin only pills which I can send it. Pt to let me know if she needs POPs sent in.   Formal u/a and ucx sent for microscopic hematuria.  - Cytology - PAP( [facility]) - POCT urinalysis dip (device) - Pregnancy, urine POC  6. Irregular periods Likely PCOS. See above  7. Vulvovaginal candidiasis Flagyl and diflucan sent in. F/u pap   8. BV (bacterial vaginosis)  Orders Placed This Encounter  Procedures  . POCT urinalysis dip (device)  . Pregnancy, urine POC    RTC 4 months  Aletha Halim, Brooke Bonito MD Attending Center for [facility])

## 2020-06-05 LAB — CYTOLOGY - PAP
Chlamydia: NEGATIVE
Comment: NEGATIVE
Comment: NEGATIVE
Comment: NEGATIVE
Comment: NORMAL
Diagnosis: NEGATIVE
High risk HPV: NEGATIVE
Neisseria Gonorrhea: NEGATIVE
Trichomonas: NEGATIVE

## 2020-06-06 ENCOUNTER — Telehealth: Payer: Self-pay

## 2020-06-06 NOTE — Telephone Encounter (Signed)
Called pt in response to MyChart message with interpreter Eda. VM left requesting a call back. Per chart review urine culture is still pending, other results appear normal.

## 2020-06-07 ENCOUNTER — Telehealth: Payer: Self-pay | Admitting: Obstetrics and Gynecology

## 2020-06-07 NOTE — Telephone Encounter (Signed)
Patient is requesting a callback for her results.

## 2020-06-10 ENCOUNTER — Telehealth: Payer: Self-pay | Admitting: Obstetrics and Gynecology

## 2020-06-10 NOTE — Telephone Encounter (Signed)
Patient's husband called to say his wife needed to speak with a nurse via [place] because she is at work. It's about birthcontrol.

## 2020-06-11 NOTE — Telephone Encounter (Signed)
Called patient with pac[place] interpreter [phone], no answer- left message to call us back if she still needs assistance.

## 2020-06-12 NOTE — Telephone Encounter (Signed)
MyChart message sent to pt and invited her to respond with her questions or concerns.

## 2021-06-18 ENCOUNTER — Emergency Department (HOSPITAL_BASED_OUTPATIENT_CLINIC_OR_DEPARTMENT_OTHER)
Admission: EM | Admit: 2021-06-18 | Discharge: 2021-06-18 | Disposition: A | Discharge status: Home / Self Care | Payer: Self-pay | Attending: Emergency Medicine | Admitting: Emergency Medicine

## 2021-06-18 ENCOUNTER — Other Ambulatory Visit: Payer: Self-pay

## 2021-06-18 ENCOUNTER — Encounter (HOSPITAL_BASED_OUTPATIENT_CLINIC_OR_DEPARTMENT_OTHER): Payer: Self-pay

## 2021-06-18 DIAGNOSIS — Z20822 Contact with and (suspected) exposure to covid-19: Secondary | ICD-10-CM | POA: Insufficient documentation

## 2021-06-18 DIAGNOSIS — J3489 Other specified disorders of nose and nasal sinuses: Secondary | ICD-10-CM | POA: Insufficient documentation

## 2021-06-18 DIAGNOSIS — I10 Essential (primary) hypertension: Secondary | ICD-10-CM | POA: Insufficient documentation

## 2021-06-18 DIAGNOSIS — J101 Influenza due to other identified influenza virus with other respiratory manifestations: Secondary | ICD-10-CM | POA: Insufficient documentation

## 2021-06-18 DIAGNOSIS — Z8616 Personal history of COVID-19: Secondary | ICD-10-CM | POA: Insufficient documentation

## 2021-06-18 LAB — RESP PANEL BY RT-PCR (FLU A&B, COVID) ARPGX2
Influenza A by PCR: POSITIVE — AB
Influenza B by PCR: NEGATIVE
SARS Coronavirus 2 by RT PCR: NEGATIVE

## 2021-06-18 MED ORDER — OSELTAMIVIR PHOSPHATE 75 MG PO CAPS: 75.0000 mg | ORAL_CAPSULE | Freq: Two times a day (BID) | ORAL | 0 refills | Status: AC

## 2021-06-18 MED ORDER — OSELTAMIVIR PHOSPHATE 75 MG PO CAPS: 75.0000 mg | ORAL_CAPSULE | Freq: Two times a day (BID) | ORAL | 0 refills | Status: DC

## 2021-06-18 NOTE — ED Provider Notes (Signed)
[place] EMERGENCY DEPARTMENT Provider Note   CSN: [id number] Arrival date & time: 06/18/21  1130     History Chief Complaint  Patient presents with   Cough    Ariel Barnes is a 40 y.o. female.   Cough Cough characteristics:  Non-productive Severity:  Moderate Duration:  2 days Timing:  Intermittent Progression:  Worsening Chronicity:  New Context: sick contacts and upper respiratory infection   Relieved by:  Nothing Worsened by:  Nothing Associated symptoms: shortness of breath   Associated symptoms: no chest pain, no chills, no fever, no headaches and no wheezing    40 year old female with a medical history below presenting to the emergency department with flulike symptoms for the past 2 days.  Additional family members also complain of influenza like illness symptoms of varying durations.  The patient endorses cough, nasal congestion, mild shortness of breath for the past 2 days.  No fevers or chills.  Overall tolerating oral intake and well-appearing.  Generalized myalgias.  Past Medical History:  Diagnosis Date   BMI 33.0-33.9,adult    COVID-19 04/2019   Hypertension     Patient Active Problem List   Diagnosis Date Noted   Language barrier 06/03/2020   Fibroid uterus 06/03/2020   Adenomyosis 06/03/2020   Irregular periods 06/03/2020   Lower abdominal pain 06/03/2020   Hypertension    BMI 33.0-33.9,adult     Past Surgical History:  Procedure Laterality Date   NO PAST SURGERIES       OB History     Gravida  2   Para  2   Term  2   Preterm      AB      Living  2      SAB      IAB      Ectopic      Multiple  0   Live Births  2           No family history on file.  Social History   Tobacco Use   Smoking status: Never   Smokeless tobacco: Never  Vaping Use   Vaping Use: Never used  Substance Use Topics   Alcohol use: No   Drug use: No    Home Medications Prior to Admission medications   Medication  Sig Start Date End Date Taking? Authorizing Provider  oseltamivir (TAMIFLU) 75 MG capsule Take 1 capsule (75 mg total) by mouth every 12 (twelve) hours. 06/18/21  Yes Regan Lemming, MD  albuterol (PROVENTIL HFA;VENTOLIN HFA) 108 (90 Base) MCG/ACT inhaler Inhale 2 puffs into the lungs every 4 (four) hours as needed for wheezing or shortness of breath. Patient not taking: Reported on 06/07/2018 01/11/18   Joy, Raquel Sarna C, PA-C  benzonatate (TESSALON) 100 MG capsule Take 1 capsule (100 mg total) by mouth every 8 (eight) hours. Patient not taking: Reported on 06/07/2018 01/11/18   Arlean Hopping C, PA-C  cyclobenzaprine (FLEXERIL) 10 MG tablet Take 1 tablet (10 mg total) by mouth at bedtime. 11/28/19   Lawyer, Harrell Gave, PA-C  dicyclomine (BENTYL) 20 MG tablet Take 1 tablet (20 mg total) by mouth 3 (three) times daily with meals as needed for up to 14 days for spasms. 06/07/18 06/21/18  Lorin Glass, PA-C  doxycycline (VIBRAMYCIN) 100 MG capsule Take 1 capsule (100 mg total) by mouth 2 (two) times daily. 06/19/18   Nuala Alpha A, PA-C  predniSONE (STERAPRED UNI-PAK 21 TAB) 10 MG (21) TBPK tablet Take 6 tabs for 2 days, then  5 for 2 days, then 4 for 2 days, then 3 for 2 days, 2 for 2 days, then 1 for 2 days 01/13/20   Isla Pence, MD  traMADol (ULTRAM) 50 MG tablet Take 1 tablet (50 mg total) by mouth every 6 (six) hours as needed for severe pain. 11/28/19   Lawyer, Harrell Gave, PA-C    Allergies    Patient has no known allergies.  Review of Systems   Review of Systems  Constitutional:  Negative for chills and fever.  Respiratory:  Positive for cough and shortness of breath. Negative for wheezing.   Cardiovascular:  Negative for chest pain.  Neurological:  Negative for headaches.  All other systems reviewed and are negative.  Physical Exam Updated Vital Signs BP 132/85 (BP Location: Left Arm)   Pulse 80   Temp 99.5 F (37.5 C) (Oral)   Resp 16   Ht 5\' 4"  (1.626 m)   Wt 85.7 kg   LMP  06/06/2021   SpO2 98%   BMI 32.44 kg/m   Physical Exam Vitals and nursing note reviewed.  Constitutional:      General: She is not in acute distress. HENT:     Head: Normocephalic and atraumatic.  Eyes:     Conjunctiva/sclera: Conjunctivae normal.     Pupils: Pupils are equal, round, and reactive to light.  Cardiovascular:     Rate and Rhythm: Normal rate and regular rhythm.  Pulmonary:     Effort: Pulmonary effort is normal. No respiratory distress.     Breath sounds: Normal breath sounds. No wheezing.  Abdominal:     General: There is no distension.     Tenderness: There is no guarding.  Musculoskeletal:        General: No deformity or signs of injury.     Cervical back: Normal range of motion and neck supple.  Skin:    Findings: No lesion or rash.  Neurological:     General: No focal deficit present.     Mental Status: She is alert. Mental status is at baseline.    ED Results / Procedures / Treatments   Labs (all labs ordered are listed, but only abnormal results are displayed) Labs Reviewed  RESP PANEL BY RT-PCR (FLU A&B, COVID) ARPGX2 - Abnormal; Notable for the following components:      Result Value   Influenza A by PCR POSITIVE (*)    All other components within normal limits    EKG None  Radiology No results found.  Procedures Procedures   Medications Ordered in ED Medications - No data to display  ED Course  I have reviewed the triage vital signs and the nursing notes.  Pertinent labs & imaging results that were available during my care of the patient were reviewed by me and considered in my medical decision making (see chart for details).    MDM Rules/Calculators/A&P                           Ariel Barnes is a 40 y.o. female who presents to the ED with a 2 day history of fever, rhinorrhea, and nasal congestion.  On my exam, the patient is well-appearing and well-hydrated.  The patient's lungs are clear to auscultation bilaterally.  Additionally, the patient has a soft/non-tender abdomen, clear tympanic membranes, and no oropharyngeal exudates.  There are no signs of meningismus.  I see no signs of an acute bacterial infection.  The patient's presentation is most  consistent with a viral upper respiratory infection.  I have a low suspicion for pneumonia as the patient's cough has been non-productive and the patient is neither tachypneic nor hypoxic on room air.  Additionally, the patient is CTAB.  Influenza/Influenza-Like Illness is possible, especially considering the current prevalence of disease. I discussed the risks and benefits of antiviral therapy. They do want to pursue antiviral therapy. Influenza testing was obtained. The patient is positive for influenza. Will prescribe Tamiflu.  I discussed symptomatic management, including hydration, motrin, and tylenol. The patient/family felt safe being discharged from the ED.  They agreed to followup with the PCP if needed.  I provided ED return precautions.  Final Clinical Impression(s) / ED Diagnoses Final diagnoses:  Influenza A    Rx / DC Orders ED Discharge Orders          Ordered    oseltamivir (TAMIFLU) 75 MG capsule  Every 12 hours        06/18/21 1427             Regan Lemming, MD 06/18/21 1503

## 2021-06-18 NOTE — ED Triage Notes (Addendum)
Pt c/o flu like sx x 2 days-NAD-steady gait-1/4 in family with flu sx

## 2021-09-08 ENCOUNTER — Emergency Department (HOSPITAL_BASED_OUTPATIENT_CLINIC_OR_DEPARTMENT_OTHER): Payer: Self-pay

## 2021-09-08 ENCOUNTER — Emergency Department (HOSPITAL_BASED_OUTPATIENT_CLINIC_OR_DEPARTMENT_OTHER)
Admission: EM | Admit: 2021-09-08 | Discharge: 2021-09-08 | Disposition: A | Discharge status: Home / Self Care | Payer: Self-pay | Attending: Emergency Medicine | Admitting: Emergency Medicine

## 2021-09-08 ENCOUNTER — Other Ambulatory Visit: Payer: Self-pay

## 2021-09-08 ENCOUNTER — Encounter (HOSPITAL_BASED_OUTPATIENT_CLINIC_OR_DEPARTMENT_OTHER): Payer: Self-pay

## 2021-09-08 DIAGNOSIS — W01110A Fall on same level from slipping, tripping and stumbling with subsequent striking against sharp glass, initial encounter: Secondary | ICD-10-CM | POA: Insufficient documentation

## 2021-09-08 DIAGNOSIS — S61411A Laceration without foreign body of right hand, initial encounter: Secondary | ICD-10-CM | POA: Insufficient documentation

## 2021-09-08 DIAGNOSIS — S60221A Contusion of right hand, initial encounter: Secondary | ICD-10-CM

## 2021-09-08 MED ORDER — BACITRACIN ZINC 500 UNIT/GM EX OINT: 1.0000 "application " | TOPICAL_OINTMENT | Freq: Two times a day (BID) | CUTANEOUS | 0 refills | Status: AC

## 2021-09-08 NOTE — Discharge Instructions (Addendum)
Please keep antibiotic ointment on the back of your hand to keep the puncture wound moist.  Keep ice on the back your hand as well to help with the swelling.  You can get an over-the-counter hand brace to help prevent you from moving her fingers around or from hurting her hand any further.  There are no fractures on your x-ray.  I recommend following up with a primary care doctor.  If you do not have 1 please call to make an appointment for him.  They can do routine health checks including blood pressure blood sugar and other screenings.  Please use Tylenol or ibuprofen for pain.  You may use 600 mg ibuprofen every 6 hours or 1000 mg of Tylenol every 6 hours.  You may choose to alternate between the 2.  This would be most effective.  Not to exceed 4 g of Tylenol within 24 hours.  Not to exceed 3200 mg ibuprofen 24 hours.   If he can become is progressively more swollen or if there is redness or any pus draining from the site of the cut to your hand you should come to the ER for reevaluation.

## 2021-09-08 NOTE — ED Triage Notes (Signed)
Pt states she fell/injured right hand yesterday-NAD-steady gait

## 2021-09-08 NOTE — ED Provider Notes (Signed)
[place] EMERGENCY DEPARTMENT Provider Note   CSN: [id number] Arrival date & time: 09/08/21  1623     History  Chief Complaint  Patient presents with   Hand Injury    Ariel Barnes is a 41 y.o. female.   Hand Injury Associated symptoms: no fever   Patient is a 41 year old female presented emergency room today after she overbalanced and fell onto her right hand yesterday she states that a piece of glass cut the dorsum of her hand she states that she was able to control bleeding with pressure.  She states she came to the ER today because she lifted her hand this morning it was more swollen.  She denies any new injuries.  She denies any crush injuries and endorses some mildly decreased sensation in her right ring finger at the base of the finger but otherwise no decree sensation.  She has not taken any medication for pain.  She states that her last tetanus was 3 to 4 years ago.    Home Medications Prior to Admission medications   Medication Sig Start Date End Date Taking? Authorizing Provider  bacitracin ointment Apply 1 application topically 2 (two) times daily. 09/08/21  Yes Eljay Lave S, PA  albuterol (PROVENTIL HFA;VENTOLIN HFA) 108 (90 Base) MCG/ACT inhaler Inhale 2 puffs into the lungs every 4 (four) hours as needed for wheezing or shortness of breath. Patient not taking: Reported on 06/07/2018 01/11/18   Joy, Raquel Sarna C, PA-C  benzonatate (TESSALON) 100 MG capsule Take 1 capsule (100 mg total) by mouth every 8 (eight) hours. Patient not taking: Reported on 06/07/2018 01/11/18   Arlean Hopping C, PA-C  cyclobenzaprine (FLEXERIL) 10 MG tablet Take 1 tablet (10 mg total) by mouth at bedtime. 11/28/19   Lawyer, Harrell Gave, PA-C  dicyclomine (BENTYL) 20 MG tablet Take 1 tablet (20 mg total) by mouth 3 (three) times daily with meals as needed for up to 14 days for spasms. 06/07/18 06/21/18  Lorin Glass, PA-C  doxycycline (VIBRAMYCIN) 100 MG capsule Take 1 capsule  (100 mg total) by mouth 2 (two) times daily. 06/19/18   Deliah Boston, PA-C  oseltamivir (TAMIFLU) 75 MG capsule Take 1 capsule (75 mg total) by mouth every 12 (twelve) hours. 06/18/21   Regan Lemming, MD  predniSONE (STERAPRED UNI-PAK 21 TAB) 10 MG (21) TBPK tablet Take 6 tabs for 2 days, then 5 for 2 days, then 4 for 2 days, then 3 for 2 days, 2 for 2 days, then 1 for 2 days 01/13/20   Isla Pence, MD  traMADol (ULTRAM) 50 MG tablet Take 1 tablet (50 mg total) by mouth every 6 (six) hours as needed for severe pain. 11/28/19   Lawyer, Harrell Gave, PA-C      Allergies    Patient has no known allergies.    Review of Systems   Review of Systems  Constitutional:  Negative for fever.  HENT:  Negative for congestion.   Respiratory:  Negative for shortness of breath.   Cardiovascular:  Negative for chest pain.  Gastrointestinal:  Negative for abdominal distention.  Musculoskeletal:        Right hand pain  Skin:  Positive for wound.  Neurological:  Negative for dizziness and headaches.   Physical Exam Updated Vital Signs BP 134/82 (BP Location: Left Arm)    Pulse 63    Temp 97.9 F (36.6 C) (Oral)    Resp 18    Ht 5\' 4"  (1.626 m)    Wt 88.5  kg    LMP 08/02/2021    SpO2 100%    BMI 33.47 kg/m  Physical Exam Vitals and nursing note reviewed.  Constitutional:      General: She is not in acute distress.    Appearance: Normal appearance. She is not ill-appearing.  HENT:     Head: Normocephalic and atraumatic.  Eyes:     General: No scleral icterus.       Right eye: No discharge.        Left eye: No discharge.     Conjunctiva/sclera: Conjunctivae normal.  Pulmonary:     Effort: Pulmonary effort is normal.     Breath sounds: No stridor.  Musculoskeletal:     Comments: Patient with bruising and some swelling over dorsum of right hand over the area of her second third and fourth metacarpal.  There is a approximately 1 cm wide laceration that is already beginning to heal.  There is no  evidence of foreign body.  There is some bruising in this area.  Full range of motion of second third and fourth finger as well as first and fifth finger which do not seem to be experiencing any swelling or pain.  No tenderness of the wrist, elbow, shoulder or C, T, L-spine.  Neurological:     Mental Status: She is alert and oriented to person, place, and time. Mental status is at baseline.    ED Results / Procedures / Treatments   Labs (all labs ordered are listed, but only abnormal results are displayed) Labs Reviewed - No data to display  EKG None  Radiology DG Hand Complete Right  Result Date: 09/08/2021 CLINICAL DATA:  Injury to the right hand with right hand pain. EXAM: RIGHT HAND - COMPLETE 3+ VIEW COMPARISON:  None. FINDINGS: There is no evidence of fracture or dislocation. There is no evidence of arthropathy or other focal bone abnormality. Soft tissues are unremarkable. IMPRESSION: Negative. Electronically Signed   By: Abelardo Diesel M.D.   On: 09/08/2021 16:58    Procedures Procedures    Medications Ordered in ED Medications - No data to display  ED Course/ Medical Decision Making/ A&P                           Medical Decision Making  Patient is a 40 year old female presenting to the ER today after mechanical fall caused her to trip forward and cut her dorsum of her right hand and also landed awkwardly on it.  She has no wrist elbow shoulder pain but has swelling and tenderness to the dorsum of the hand.  She is able to move all fingers has cap refill sensation and good movement in all fingers.  \She has a small healing cut to the dorsum of the right hand but not see any foreign bodies present and very personally reviewed x-ray of hand does not show any.  We lengthy discussion about the possibility of retained foreign body and recommended follow-up with PCP.  Recommend ice elevation Tylenol ibuprofen  She understands that swelling will take a while for  improvement.  Return precautions given.  She is up-to-date on tetanus.  Discharge instructions below  Please keep antibiotic ointment on the back of your hand to keep the puncture wound moist.  Keep ice on the back your hand as well to help with the swelling.  You can get an over-the-counter hand brace to help prevent you from moving her fingers around or from  hurting her hand any further.  There are no fractures on your x-ray.  I recommend following up with a primary care doctor.  If you do not have 1 please call to make an appointment for him.  They can do routine health checks including blood pressure blood sugar and other screenings.  Please use Tylenol or ibuprofen for pain.  You may use 600 mg ibuprofen every 6 hours or 1000 mg of Tylenol every 6 hours.  You may choose to alternate between the 2.  This would be most effective.  Not to exceed 4 g of Tylenol within 24 hours.  Not to exceed 3200 mg ibuprofen 24 hours.   If he can become is progressively more swollen or if there is redness or any pus draining from the site of the cut to your hand you should come to the ER for reevaluation.  Final Clinical Impression(s) / ED Diagnoses Final diagnoses:  Contusion of right hand, initial encounter    Rx / DC Orders ED Discharge Orders          Ordered    bacitracin ointment  2 times daily        09/08/21 1710              Pati Gallo [place], [place] 09/08/21 1757    Lucrezia Starch, MD 09/08/21 2144

## 2021-11-04 ENCOUNTER — Emergency Department (HOSPITAL_BASED_OUTPATIENT_CLINIC_OR_DEPARTMENT_OTHER)
Admission: EM | Admit: 2021-11-04 | Discharge: 2021-11-04 | Disposition: A | Discharge status: Home / Self Care | Payer: Self-pay | Attending: Emergency Medicine | Admitting: Emergency Medicine

## 2021-11-04 ENCOUNTER — Encounter (HOSPITAL_BASED_OUTPATIENT_CLINIC_OR_DEPARTMENT_OTHER): Payer: Self-pay | Admitting: Emergency Medicine

## 2021-11-04 DIAGNOSIS — T24212A Burn of second degree of left thigh, initial encounter: Secondary | ICD-10-CM | POA: Insufficient documentation

## 2021-11-04 DIAGNOSIS — T310 Burns involving less than 10% of body surface: Secondary | ICD-10-CM | POA: Insufficient documentation

## 2021-11-04 DIAGNOSIS — X100XXA Contact with hot drinks, initial encounter: Secondary | ICD-10-CM | POA: Insufficient documentation

## 2021-11-04 MED ORDER — BACITRACIN-NEOMYCIN-POLYMYXIN 400-5-5000 EX OINT: 1.0000 "application " | TOPICAL_OINTMENT | Freq: Two times a day (BID) | CUTANEOUS | 0 refills | Status: AC

## 2021-11-04 MED ORDER — BACITRACIN ZINC 500 UNIT/GM EX OINT: TOPICAL_OINTMENT | Freq: Two times a day (BID) | CUTANEOUS | Status: DC

## 2021-11-04 NOTE — ED Triage Notes (Signed)
Pt brought in by EMS for burn to left thigh  Pt states she bought coffee at [employer] and the lid was not on properly and she spilled the coffee    Pt has some redness noted to her left thigh  ?
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
## 2024-09-25 ENCOUNTER — Emergency Department (HOSPITAL_BASED_OUTPATIENT_CLINIC_OR_DEPARTMENT_OTHER)

## 2024-09-25 ENCOUNTER — Encounter (HOSPITAL_BASED_OUTPATIENT_CLINIC_OR_DEPARTMENT_OTHER): Payer: Self-pay

## 2024-09-25 ENCOUNTER — Other Ambulatory Visit: Payer: Self-pay

## 2024-09-25 ENCOUNTER — Emergency Department (HOSPITAL_BASED_OUTPATIENT_CLINIC_OR_DEPARTMENT_OTHER)
Admission: EM | Admit: 2024-09-25 | Discharge: 2024-09-25 | Disposition: A | Discharge status: Home / Self Care | Attending: Emergency Medicine | Admitting: Emergency Medicine

## 2024-09-25 DIAGNOSIS — R10A2 Flank pain, left side: Secondary | ICD-10-CM

## 2024-09-25 DIAGNOSIS — Z79899 Other long term (current) drug therapy: Secondary | ICD-10-CM | POA: Insufficient documentation

## 2024-09-25 DIAGNOSIS — I10 Essential (primary) hypertension: Secondary | ICD-10-CM | POA: Insufficient documentation

## 2024-09-25 DIAGNOSIS — K8020 Calculus of gallbladder without cholecystitis without obstruction: Secondary | ICD-10-CM

## 2024-09-25 DIAGNOSIS — R350 Frequency of micturition: Secondary | ICD-10-CM | POA: Insufficient documentation

## 2024-09-25 DIAGNOSIS — R197 Diarrhea, unspecified: Secondary | ICD-10-CM | POA: Insufficient documentation

## 2024-09-25 DIAGNOSIS — R112 Nausea with vomiting, unspecified: Secondary | ICD-10-CM | POA: Insufficient documentation

## 2024-09-25 DIAGNOSIS — R1013 Epigastric pain: Secondary | ICD-10-CM | POA: Insufficient documentation

## 2024-09-25 LAB — COMPREHENSIVE METABOLIC PANEL WITH GFR
ALT: 34 U/L (ref 0–44)
AST: 25 U/L (ref 15–41)
Albumin: 4.2 g/dL (ref 3.5–5.0)
Alkaline Phosphatase: 126 U/L (ref 38–126)
Anion gap: 10 (ref 5–15)
BUN: 11 mg/dL (ref 6–20)
CO2: 23 mmol/L (ref 22–32)
Calcium: 8.9 mg/dL (ref 8.9–10.3)
Chloride: 104 mmol/L (ref 98–111)
Creatinine, Ser: 0.77 mg/dL (ref 0.44–1.00)
GFR, Estimated: >60 mL/min
Glucose, Bld: 118 mg/dL — ABNORMAL HIGH (ref 70–99)
Potassium: 3.8 mmol/L (ref 3.5–5.1)
Sodium: 138 mmol/L (ref 135–145)
Total Bilirubin: 0.4 mg/dL (ref 0.0–1.2)
Total Protein: 7 g/dL (ref 6.5–8.1)

## 2024-09-25 LAB — RESP PANEL BY RT-PCR (RSV, FLU A&B, COVID)  RVPGX2
Influenza A by PCR: NEGATIVE
Influenza B by PCR: NEGATIVE
Resp Syncytial Virus by PCR: NEGATIVE
SARS Coronavirus 2 by RT PCR: NEGATIVE

## 2024-09-25 LAB — URINALYSIS, MICROSCOPIC (REFLEX): WBC, UA: NONE SEEN WBC/hpf (ref 0–5)

## 2024-09-25 LAB — CBC
HCT: 42.5 % (ref 36.0–46.0)
Hemoglobin: 14.2 g/dL (ref 12.0–15.0)
MCH: 28.2 pg (ref 26.0–34.0)
MCHC: 33.4 g/dL (ref 30.0–36.0)
MCV: 84.5 fL (ref 80.0–100.0)
Platelets: 320 10*3/uL (ref 150–400)
RBC: 5.03 MIL/uL (ref 3.87–5.11)
RDW: 12.8 % (ref 11.5–15.5)
WBC: 14.9 10*3/uL — ABNORMAL HIGH (ref 4.0–10.5)
nRBC: 0 % (ref 0.0–0.2)

## 2024-09-25 LAB — URINALYSIS, ROUTINE W REFLEX MICROSCOPIC
Bilirubin Urine: NEGATIVE
Glucose, UA: NEGATIVE mg/dL
Ketones, ur: NEGATIVE mg/dL
Leukocytes,Ua: NEGATIVE
Nitrite: NEGATIVE
Protein, ur: NEGATIVE mg/dL
Specific Gravity, Urine: 1.015 (ref 1.005–1.030)
pH: 7.5 (ref 5.0–8.0)

## 2024-09-25 LAB — PREGNANCY, URINE: Preg Test, Ur: NEGATIVE

## 2024-09-25 LAB — LIPASE, BLOOD: Lipase: 21 U/L (ref 11–51)

## 2024-09-25 MED ORDER — KETOROLAC TROMETHAMINE 15 MG/ML IJ SOLN
15.0000 mg | Freq: Once | INTRAMUSCULAR | Status: AC
  Administered 2024-09-25: 15 mg via INTRAVENOUS
  Filled 2024-09-25: qty 1

## 2024-09-25 MED ORDER — MORPHINE SULFATE (PF) 4 MG/ML IV SOLN
4.0000 mg | Freq: Once | INTRAVENOUS | Status: AC
  Administered 2024-09-25: 4 mg via INTRAVENOUS
  Filled 2024-09-25: qty 1

## 2024-09-25 MED ORDER — OXYCODONE-ACETAMINOPHEN 5-325 MG PO TABS: 1.0000 | ORAL_TABLET | Freq: Four times a day (QID) | ORAL | 0 refills | Status: AC | PRN

## 2024-09-25 MED ORDER — ONDANSETRON 4 MG PO TBDP: 4.0000 mg | ORAL_TABLET | Freq: Three times a day (TID) | ORAL | 0 refills | Status: AC | PRN

## 2024-09-25 MED ORDER — LACTATED RINGERS IV BOLUS
1000.0000 mL | Freq: Once | INTRAVENOUS | Status: AC
  Administered 2024-09-25: 1000 mL via INTRAVENOUS

## 2024-09-25 MED ORDER — DICYCLOMINE HCL 20 MG PO TABS: 20.0000 mg | ORAL_TABLET | Freq: Two times a day (BID) | ORAL | 0 refills | Status: AC

## 2024-09-25 MED ORDER — ONDANSETRON HCL 4 MG/2ML IJ SOLN
4.0000 mg | Freq: Once | INTRAMUSCULAR | Status: AC | PRN
  Administered 2024-09-25: 4 mg via INTRAVENOUS
  Filled 2024-09-25: qty 2

## 2024-09-25 MED ORDER — LIDOCAINE 5 % EX PTCH: 1.0000 | MEDICATED_PATCH | CUTANEOUS | 0 refills | Status: AC

## 2024-09-25 MED ORDER — IOHEXOL 300 MG/ML  SOLN
75.0000 mL | Freq: Once | INTRAMUSCULAR | Status: AC | PRN
  Administered 2024-09-25: 75 mL via INTRAVENOUS

## 2024-09-25 NOTE — ED Provider Notes (Signed)
" °  Physical Exam   Vitals:   09/25/24 1714  BP: (!) 153/99  Pulse: 86  Resp: 18  Temp: 97.8 F (36.6 C)  TempSrc: Oral  SpO2: 100%     Physical Exam  Procedures  Procedures  ED Course / MDM   Clinical Course as of 09/25/24 1849  Mon Sep 25, 2024  1840 Temp: 97.8 F (36.6 C) Afebrile, vitals stable, patient in no acute distress. [ML]  1840 Resp panel by RT-PCR (RSV, Flu A&B, Covid) Anterior Nasal Swab Negative [ML]  1840 Patient given ketorolac , ondansetron , morphine , lactated ringer  bolus for symptomatic relief -  [ML]  1842 EKG 12-Lead Sinus rhythm [ML]    Clinical Course User Index [ML] Willma Duwaine CROME, PA   Medical Decision Making Amount and/or Complexity of Data Reviewed Labs: ordered. Decision-making details documented in ED Course. Radiology: ordered. ECG/medicine tests:  Decision-making details documented in ED Course.  Risk Prescription drug management.   Patient received at shift change from prior EDP Megan Lloyd PA-C, see their note for initial history, physical exam findings, initial assessment/plan.  Patient presenting with concern for left flank pain ongoing for 1 week with associated nausea/vomiting/diarrhea as well as urinary frequency, no dysuria/hematuria.  Left CVA tenderness noted on exam with some radiation to the L anterior abdomen/epigastric region.  Patient initially given Toradol  for pain, did not note any symptomatic improvement, subsequently given morphine /Zofran /LR fluid bolus.  Respiratory viral panel negative, remainder of workup pending including CT abdomen/pelvis  CBC notable for leukocytosis with white blood cell count of 14.9 CMP unremarkable Lipase within normal limits, 21 Urinalysis with trace RBCs and rare bacteria, unlikely reflective of true urinary tract infection with negative leukocytes/nitrites however will send for culture Urine hCG negative  CT abdomen/pelvis: 1. No acute findings in the abdomen or pelvis. 2.  Cholelithiasis without evidence of acute cholecystitis.  At time of my reassessment, patient notes symptomatic improvement after morphine /Zofran .  Patient describes left flank pain, on exam the location of the pain she is describing is more in the left thoracic muscle region, there are no rashes/lesions in this area, tenderness is not reproducible on exam.  Patient found to have gallstones as noted on CT scan, negative Murphy's, abdomen is soft and nontender at this point.  I discussed in depth with the patient that gallstones are unlikely to be contributing to her left flank/back pain, and this is likely an incidental finding, however the presence of gallstones may be the cause for her intermittent episodes of nausea/vomiting.  I recommend that she follow-up with [facility] surgery, as she may benefit from an elective cholecystectomy in the future.  I recommend that she avoid foods high in fat, as this can contribute to worsening symptoms. Will prescribe Percocet to be used as needed for breakthrough pain, Zofran  as needed for nausea/vomiting, recommend ibuprofen /Tylenol /lidocaine  patches for left back and flank pain as it is likely musculoskeletal in nature.  Recommend use of Bentyl  for episodes of abdominal spasming/cramping, Imodium as needed for diarrhea, MiraLAX/Colace as needed for constipation. Strict return precautions discussed, patient voiced understanding is in agreement this plan, she is appropriate for discharge at this time.        Glendia Rocky SAILOR, [place] 09/25/24 2140    Tegeler, Lonni PARAS, MD 09/25/24 [phone]  "

## 2024-09-25 NOTE — ED Notes (Signed)
 Patient transported to CT

## 2024-09-25 NOTE — ED Notes (Signed)

## 2024-09-25 NOTE — ED Notes (Signed)
 Lab notified of CBC, CMP, lipase orders. Specimens already at lab.

## 2024-09-25 NOTE — ED Notes (Signed)
Lab notified of urine culture add on 

## 2024-09-25 NOTE — Discharge Instructions (Addendum)
 Your CT scan today does show some stones in your gallbladder, this typically causes pain in the right upper quadrant of your abdomen that can be associated with nausea and vomiting, however this is unlikely to be contributing to your left back/flank pain.  If you experience severe abdominal pain in the right upper quadrant, this is likely due to your gallbladder; if this occurs, please take Percocet every 6 hours as needed for severe pain and return to the emergency department if your pain persists.  You may also take Zofran  every 8 hours as needed for nausea/vomiting. I have provided you with the contact information for a general surgery office, [facility], please contact their office to schedule an outpatient follow-up, as you may benefit from removal of your gallbladder at some point down the line. I suspect that your left back/flank pain is likely musculoskeletal in nature, please continue ibuprofen /Tylenol  as needed for pain, you may also use lidocaine  patches. If you continue to experience diarrhea, you may try Imodium, which can be found over-the-counter.  If you experience abdominal cramping or spasming, you may use Bentyl  up to twice daily.  If you experience constipation, you may use MiraLAX and a stool softener like Colace, which can be found over-the-counter. Follow-up with your primary care provider.  Return to the emergency department if your symptoms worsen.

## 2024-09-26 LAB — URINE CULTURE: Culture: 30000 — AB

## 2024-09-27 ENCOUNTER — Telehealth (HOSPITAL_BASED_OUTPATIENT_CLINIC_OR_DEPARTMENT_OTHER): Payer: Self-pay | Admitting: *Deleted

## 2024-09-27 NOTE — Telephone Encounter (Signed)
 Post ED Visit - Positive Culture Follow-up  Culture report reviewed by antimicrobial stewardship pharmacist: [facility] [x]  Leonor Bash Pharm.D. []  Venetia Gully, Pharm.D., BCPS AQ-ID []  Garrel Crews, Pharm.D., BCPS []  Almarie Lunger, Pharm.D., BCPS []  [place], [address].D., BCPS, AAHIVP []  Rosaline Bihari, Pharm.D., BCPS, AAHIVP []  Vernell Meier, PharmD, BCPS []  Latanya Hint, PharmD, BCPS []  Donald Medley, PharmD, BCPS []  Rocky Bold, PharmD []  Dorothyann Alert, PharmD, BCPS []  Morene Babe, PharmD  [facility] []  Rosaline Edison, PharmD []  Romona Bliss, PharmD []  Dolphus Roller, PharmD []  Veva Seip, Rph []  Vernell Daunt) Leonce, PharmD []  Eva Allis, PharmD []  Rosaline Millet, PharmD []  Iantha Batch, PharmD []  Arvin Gauss, PharmD []  Wanda Hasting, PharmD []  Ronal Rav, PharmD []  Rocky Slade, PharmD []  Bard Jeans, PharmD   Positive Urine culture No urinary symptoms sensitive, no further patient follow-up is required at this time.  Ariel Barnes 09/27/2024, 11:49 AM
